# Patient Record
Sex: Female | Born: 1955 | Race: Black or African American | Hispanic: No | Marital: Married | Smoking: Never smoker
Health system: Southern US, Community
[De-identification: ages and names within clinical notes are randomized; demographics above are authoritative.]

## PROBLEM LIST (undated history)

## (undated) DIAGNOSIS — I10 Essential (primary) hypertension: Secondary | ICD-10-CM

## (undated) DIAGNOSIS — E7800 Pure hypercholesterolemia, unspecified: Secondary | ICD-10-CM

## (undated) DIAGNOSIS — R0981 Nasal congestion: Secondary | ICD-10-CM

## (undated) DIAGNOSIS — M1990 Unspecified osteoarthritis, unspecified site: Secondary | ICD-10-CM

## (undated) HISTORY — PX: ABDOMINAL HYSTERECTOMY: SHX81

## (undated) HISTORY — PX: BREAST SURGERY: SHX581

## (undated) HISTORY — PX: FOOT SURGERY: SHX648

## (undated) HISTORY — PX: TUBAL LIGATION: SHX77

---

## 1997-12-02 ENCOUNTER — Other Ambulatory Visit: Admission: RE | Admit: 1997-12-02 | Discharge: 1997-12-02 | Payer: Self-pay | Admitting: Radiology

## 1997-12-27 ENCOUNTER — Ambulatory Visit (HOSPITAL_COMMUNITY): Admission: RE | Admit: 1997-12-27 | Discharge: 1997-12-27 | Payer: Self-pay | Admitting: *Deleted

## 2001-02-04 ENCOUNTER — Other Ambulatory Visit: Admission: RE | Admit: 2001-02-04 | Discharge: 2001-02-04 | Payer: Self-pay | Admitting: Obstetrics & Gynecology

## 2001-07-30 ENCOUNTER — Encounter: Admission: RE | Admit: 2001-07-30 | Discharge: 2001-07-30 | Payer: Self-pay | Admitting: Obstetrics & Gynecology

## 2001-07-30 ENCOUNTER — Encounter: Payer: Self-pay | Admitting: Obstetrics & Gynecology

## 2001-11-14 ENCOUNTER — Inpatient Hospital Stay (HOSPITAL_COMMUNITY): Admission: RE | Admit: 2001-11-14 | Discharge: 2001-11-16 | Payer: Self-pay | Admitting: Obstetrics & Gynecology

## 2003-02-03 ENCOUNTER — Other Ambulatory Visit: Admission: RE | Admit: 2003-02-03 | Discharge: 2003-02-03 | Payer: Self-pay | Admitting: Obstetrics & Gynecology

## 2003-10-01 ENCOUNTER — Ambulatory Visit (HOSPITAL_COMMUNITY): Admission: RE | Admit: 2003-10-01 | Discharge: 2003-10-01 | Payer: Self-pay | Admitting: Geriatric Medicine

## 2004-06-26 ENCOUNTER — Emergency Department (HOSPITAL_COMMUNITY): Admission: EM | Admit: 2004-06-26 | Discharge: 2004-06-26 | Payer: Self-pay | Admitting: Emergency Medicine

## 2010-09-16 ENCOUNTER — Encounter: Payer: Self-pay | Admitting: Geriatric Medicine

## 2010-10-26 ENCOUNTER — Emergency Department (INDEPENDENT_AMBULATORY_CARE_PROVIDER_SITE_OTHER): Payer: BC Managed Care – HMO

## 2010-10-26 ENCOUNTER — Emergency Department (HOSPITAL_BASED_OUTPATIENT_CLINIC_OR_DEPARTMENT_OTHER)
Admission: EM | Admit: 2010-10-26 | Discharge: 2010-10-26 | Disposition: A | Discharge status: Home / Self Care | Payer: BC Managed Care – HMO | Attending: Emergency Medicine | Admitting: Emergency Medicine

## 2010-10-26 DIAGNOSIS — R079 Chest pain, unspecified: Secondary | ICD-10-CM

## 2010-10-26 DIAGNOSIS — I10 Essential (primary) hypertension: Secondary | ICD-10-CM | POA: Insufficient documentation

## 2010-10-26 DIAGNOSIS — M25519 Pain in unspecified shoulder: Secondary | ICD-10-CM | POA: Insufficient documentation

## 2010-10-26 DIAGNOSIS — I517 Cardiomegaly: Secondary | ICD-10-CM | POA: Insufficient documentation

## 2010-10-26 DIAGNOSIS — R51 Headache: Secondary | ICD-10-CM | POA: Insufficient documentation

## 2010-10-26 LAB — CBC
HCT: 39.3 % (ref 36.0–46.0)
Hemoglobin: 13.4 g/dL (ref 12.0–15.0)
MCH: 29 pg (ref 26.0–34.0)
MCHC: 34.1 g/dL (ref 30.0–36.0)
MCV: 85.1 fL (ref 78.0–100.0)
Platelets: 248 10*3/uL (ref 150–400)
RBC: 4.62 MIL/uL (ref 3.87–5.11)
RDW: 13.3 % (ref 11.5–15.5)
WBC: 5.6 10*3/uL (ref 4.0–10.5)

## 2010-10-26 LAB — DIFFERENTIAL
Basophils Absolute: 0 10*3/uL (ref 0.0–0.1)
Basophils Relative: 0 % (ref 0–1)
Eosinophils Absolute: 0.1 10*3/uL (ref 0.0–0.7)
Eosinophils Relative: 2 % (ref 0–5)
Lymphocytes Relative: 33 % (ref 12–46)
Lymphs Abs: 1.9 10*3/uL (ref 0.7–4.0)
Monocytes Absolute: 0.4 10*3/uL (ref 0.1–1.0)
Monocytes Relative: 8 % (ref 3–12)
Neutro Abs: 3.2 10*3/uL (ref 1.7–7.7)
Neutrophils Relative %: 57 % (ref 43–77)

## 2010-10-26 LAB — BASIC METABOLIC PANEL
BUN: 11 mg/dL (ref 6–23)
Chloride: 107 mEq/L (ref 96–112)
Creatinine, Ser: 0.7 mg/dL (ref 0.4–1.2)
Glucose, Bld: 88 mg/dL (ref 70–99)
Potassium: 3.9 mEq/L (ref 3.5–5.1)

## 2010-10-26 LAB — POCT CARDIAC MARKERS
CKMB, poc: <1 ng/mL — ABNORMAL LOW (ref 1.0–8.0)
CKMB, poc: <1 ng/mL — ABNORMAL LOW (ref 1.0–8.0)
Myoglobin, poc: 77 ng/mL (ref 12–200)
Troponin i, poc: <0.05 ng/mL (ref 0.00–0.09)
Troponin i, poc: <0.05 ng/mL (ref 0.00–0.09)

## 2010-10-26 LAB — D-DIMER, QUANTITATIVE: D-Dimer, Quant: 0.73 ug/mL-FEU — ABNORMAL HIGH (ref 0.00–0.48)

## 2010-10-26 MED ORDER — IOHEXOL 350 MG/ML SOLN
80.0000 mL | Freq: Once | INTRAVENOUS | Status: AC | PRN
  Administered 2010-10-26: 80 mL via INTRAVENOUS

## 2015-11-04 ENCOUNTER — Other Ambulatory Visit: Payer: Self-pay | Admitting: Physician Assistant

## 2015-11-04 NOTE — H&P (Signed)
Belinda Arellano comes in for consultation and treatment recommendation for ongoing continued symptoms, right heel.  Chronic problem much worse since September.  Calcification attachment spurs.  Significant tendonitis Achilles distally.  Haglund deformity.  I have looked at her previous x-rays that showed a Haglund deformity, as well as the marked spurring fragmentation Achilles attachment.  That is where all of her symptoms are.  She has exhausted conservative treatment, including anti-inflammatories, Pennsaid gel, as well as Nitroglycerin patches.  She has been doing stretching.  This has gotten the best of her.  Some days not so bad, but really not any days without pain.  She comes in to discuss definitive treatment.   Remaining history is reviewed.  Of note, she was seen back a couple of years ago for a medial meniscus tear, right knee, with associated degenerative changes there.  Although she still has some symptoms she opted not to have surgery and those have not worsened.    EXAMINATION: General exam is outlined and included in the chart.  Lungs clear to auscultation bilaterally.  Heart sounds normal.  Specifically, exquisitely tender Achilles attachment on the right.  Virtually no dorsiflexion with the knee extended.  Tender over the Haglund deformity with pre Achilles bursal swelling as well.  Nothing dramatic in the opposite left heel in regards to tenderness, swelling or loss of motion.  DISPOSITION:  We have discussed definitive treatment.  This is not going to get better short of treatment and she understands that.  We have discussed exam under anesthesia, posterior approach.  Takedown of her Achilles, removal of all spurs, removal of Haglund deformity, excise the pre Achilles bursa and then repair and reattachment of the Achilles back to the os calcis with a SpeedBridge technique.  Procedure, risks, benefits and complications reviewed.  Anticipated recovery and outcome and what is involved all outlined.   More than 30 minutes spent face-to-face covering all of this with her.  She understands and agrees.  We will see her at the time of operative intervention.  Ninetta Lights, M.D.

## 2015-11-07 ENCOUNTER — Encounter (HOSPITAL_BASED_OUTPATIENT_CLINIC_OR_DEPARTMENT_OTHER): Payer: Self-pay | Admitting: *Deleted

## 2015-11-10 ENCOUNTER — Encounter (HOSPITAL_BASED_OUTPATIENT_CLINIC_OR_DEPARTMENT_OTHER): Payer: Self-pay | Admitting: Anesthesiology

## 2015-11-10 ENCOUNTER — Ambulatory Visit (HOSPITAL_BASED_OUTPATIENT_CLINIC_OR_DEPARTMENT_OTHER): Payer: BLUE CROSS/BLUE SHIELD | Admitting: Anesthesiology

## 2015-11-10 ENCOUNTER — Ambulatory Visit (HOSPITAL_BASED_OUTPATIENT_CLINIC_OR_DEPARTMENT_OTHER)
Admission: RE | Admit: 2015-11-10 | Discharge: 2015-11-10 | Disposition: A | Discharge status: Home / Self Care | Payer: BLUE CROSS/BLUE SHIELD | Source: Ambulatory Visit | Attending: Orthopedic Surgery | Admitting: Orthopedic Surgery

## 2015-11-10 ENCOUNTER — Encounter (HOSPITAL_BASED_OUTPATIENT_CLINIC_OR_DEPARTMENT_OTHER)
Admission: RE | Disposition: A | Discharge status: Home / Self Care | Payer: Self-pay | Source: Ambulatory Visit | Attending: Orthopedic Surgery

## 2015-11-10 DIAGNOSIS — M7661 Achilles tendinitis, right leg: Secondary | ICD-10-CM | POA: Diagnosis present

## 2015-11-10 DIAGNOSIS — S86011A Strain of right Achilles tendon, initial encounter: Secondary | ICD-10-CM | POA: Insufficient documentation

## 2015-11-10 DIAGNOSIS — X58XXXA Exposure to other specified factors, initial encounter: Secondary | ICD-10-CM | POA: Diagnosis not present

## 2015-11-10 HISTORY — DX: Unspecified osteoarthritis, unspecified site: M19.90

## 2015-11-10 HISTORY — DX: Nasal congestion: R09.81

## 2015-11-10 HISTORY — PX: EXCISION HAGLUND'S DEFORMITY WITH ACHILLES TENDON REPAIR: SHX5627

## 2015-11-10 SURGERY — EXCISION HAGLUND'S DEFORMITY WITH ACHILLES TENDON REPAIR
Anesthesia: General | Site: Ankle | Laterality: Right

## 2015-11-10 MED ORDER — FENTANYL CITRATE (PF) 100 MCG/2ML IJ SOLN
INTRAMUSCULAR | Status: AC
  Filled 2015-11-10: qty 2

## 2015-11-10 MED ORDER — DEXAMETHASONE SODIUM PHOSPHATE 4 MG/ML IJ SOLN
INTRAMUSCULAR | Status: DC | PRN
  Administered 2015-11-10: 10 mg via INTRAVENOUS

## 2015-11-10 MED ORDER — ONDANSETRON HCL 4 MG/2ML IJ SOLN
INTRAMUSCULAR | Status: AC
  Filled 2015-11-10: qty 2

## 2015-11-10 MED ORDER — ONDANSETRON HCL 4 MG PO TABS: 4.0000 mg | ORAL_TABLET | Freq: Three times a day (TID) | ORAL | Status: DC | PRN

## 2015-11-10 MED ORDER — PROPOFOL 500 MG/50ML IV EMUL
INTRAVENOUS | Status: AC
  Filled 2015-11-10: qty 50

## 2015-11-10 MED ORDER — CEFAZOLIN SODIUM-DEXTROSE 2-3 GM-% IV SOLR
2.0000 g | INTRAVENOUS | Status: AC
  Administered 2015-11-10: 2 g via INTRAVENOUS

## 2015-11-10 MED ORDER — FENTANYL CITRATE (PF) 100 MCG/2ML IJ SOLN
25.0000 ug | INTRAMUSCULAR | Status: DC | PRN
  Administered 2015-11-10: 25 ug via INTRAVENOUS

## 2015-11-10 MED ORDER — PHENYLEPHRINE 40 MCG/ML (10ML) SYRINGE FOR IV PUSH (FOR BLOOD PRESSURE SUPPORT)
PREFILLED_SYRINGE | INTRAVENOUS | Status: AC
  Filled 2015-11-10: qty 10

## 2015-11-10 MED ORDER — OXYCODONE-ACETAMINOPHEN 5-325 MG PO TABS: 1.0000 | ORAL_TABLET | ORAL | Status: DC | PRN

## 2015-11-10 MED ORDER — SCOPOLAMINE 1 MG/3DAYS TD PT72: 1.0000 | MEDICATED_PATCH | Freq: Once | TRANSDERMAL | Status: DC | PRN

## 2015-11-10 MED ORDER — DEXAMETHASONE SODIUM PHOSPHATE 10 MG/ML IJ SOLN
INTRAMUSCULAR | Status: AC
  Filled 2015-11-10: qty 1

## 2015-11-10 MED ORDER — PROPOFOL 10 MG/ML IV BOLUS
INTRAVENOUS | Status: DC | PRN
  Administered 2015-11-10: 230 mg via INTRAVENOUS

## 2015-11-10 MED ORDER — LACTATED RINGERS IV SOLN: INTRAVENOUS | Status: DC

## 2015-11-10 MED ORDER — LIDOCAINE HCL (CARDIAC) 20 MG/ML IV SOLN
INTRAVENOUS | Status: DC | PRN
  Administered 2015-11-10: 50 mg via INTRAVENOUS

## 2015-11-10 MED ORDER — LACTATED RINGERS IV SOLN
INTRAVENOUS | Status: DC
  Administered 2015-11-10: 10 mL/h via INTRAVENOUS

## 2015-11-10 MED ORDER — MIDAZOLAM HCL 2 MG/2ML IJ SOLN
1.0000 mg | INTRAMUSCULAR | Status: DC | PRN
Start: 2015-11-10 — End: 2015-11-10

## 2015-11-10 MED ORDER — GLYCOPYRROLATE 0.2 MG/ML IJ SOLN: 0.2000 mg | Freq: Once | INTRAMUSCULAR | Status: DC | PRN

## 2015-11-10 MED ORDER — EPHEDRINE SULFATE 50 MG/ML IJ SOLN
INTRAMUSCULAR | Status: AC
  Filled 2015-11-10: qty 1

## 2015-11-10 MED ORDER — ONDANSETRON HCL 4 MG/2ML IJ SOLN
INTRAMUSCULAR | Status: DC | PRN
  Administered 2015-11-10: 4 mg via INTRAVENOUS

## 2015-11-10 MED ORDER — SUCCINYLCHOLINE CHLORIDE 20 MG/ML IJ SOLN
INTRAMUSCULAR | Status: DC | PRN
  Administered 2015-11-10: 80 mg via INTRAVENOUS

## 2015-11-10 MED ORDER — CEFAZOLIN SODIUM-DEXTROSE 2-3 GM-% IV SOLR
INTRAVENOUS | Status: AC
  Filled 2015-11-10: qty 50

## 2015-11-10 MED ORDER — LIDOCAINE HCL (CARDIAC) 20 MG/ML IV SOLN
INTRAVENOUS | Status: AC
  Filled 2015-11-10: qty 5

## 2015-11-10 MED ORDER — CHLORHEXIDINE GLUCONATE 4 % EX LIQD: 60.0000 mL | Freq: Once | CUTANEOUS | Status: DC

## 2015-11-10 MED ORDER — MIDAZOLAM HCL 2 MG/2ML IJ SOLN
INTRAMUSCULAR | Status: AC
  Filled 2015-11-10: qty 2

## 2015-11-10 MED ORDER — FENTANYL CITRATE (PF) 100 MCG/2ML IJ SOLN
50.0000 ug | INTRAMUSCULAR | Status: DC | PRN
  Administered 2015-11-10 (×2): 50 ug via INTRAVENOUS

## 2015-11-10 SURGICAL SUPPLY — 69 items
BANDAGE ACE 4X5 VEL STRL LF (GAUZE/BANDAGES/DRESSINGS) ×2 IMPLANT
BANDAGE ACE 6X5 VEL STRL LF (GAUZE/BANDAGES/DRESSINGS) ×2 IMPLANT
BANDAGE ESMARK 6X9 LF (GAUZE/BANDAGES/DRESSINGS) ×1 IMPLANT
BLADE SURG 15 STRL LF DISP TIS (BLADE) ×2 IMPLANT
BLADE SURG 15 STRL SS (BLADE) ×2
BNDG COHESIVE 4X5 TAN STRL (GAUZE/BANDAGES/DRESSINGS) ×2 IMPLANT
BNDG ESMARK 6X9 LF (GAUZE/BANDAGES/DRESSINGS) ×2
CANISTER SUCT 1200ML W/VALVE (MISCELLANEOUS) IMPLANT
COVER BACK TABLE 60X90IN (DRAPES) ×2 IMPLANT
CUFF TOURNIQUET SINGLE 34IN LL (TOURNIQUET CUFF) IMPLANT
CUFF TOURNIQUET SINGLE 44IN (TOURNIQUET CUFF) ×2 IMPLANT
DRAPE EXTREMITY T 121X128X90 (DRAPE) ×2 IMPLANT
DRAPE IMP U-DRAPE 54X76 (DRAPES) ×2 IMPLANT
DRAPE OEC MINIVIEW 54X84 (DRAPES) ×2 IMPLANT
DRAPE U-SHAPE 47X51 STRL (DRAPES) ×2 IMPLANT
DRSG PAD ABDOMINAL 8X10 ST (GAUZE/BANDAGES/DRESSINGS) ×2 IMPLANT
DRSG XEROFORM 1X8 (GAUZE/BANDAGES/DRESSINGS) ×2 IMPLANT
DURAPREP 26ML APPLICATOR (WOUND CARE) ×2 IMPLANT
ELECT REM PT RETURN 9FT ADLT (ELECTROSURGICAL) ×2
ELECTRODE REM PT RTRN 9FT ADLT (ELECTROSURGICAL) ×1 IMPLANT
GAUZE SPONGE 4X4 12PLY STRL (GAUZE/BANDAGES/DRESSINGS) ×2 IMPLANT
GAUZE XEROFORM 1X8 LF (GAUZE/BANDAGES/DRESSINGS) IMPLANT
GLOVE BIOGEL PI IND STRL 7.0 (GLOVE) ×1 IMPLANT
GLOVE BIOGEL PI INDICATOR 7.0 (GLOVE) ×1
GLOVE ECLIPSE 7.0 STRL STRAW (GLOVE) ×2 IMPLANT
GLOVE SURG ORTHO 8.0 STRL STRW (GLOVE) ×2 IMPLANT
GOWN STRL REUS W/ TWL LRG LVL3 (GOWN DISPOSABLE) ×2 IMPLANT
GOWN STRL REUS W/ TWL XL LVL3 (GOWN DISPOSABLE) ×1 IMPLANT
GOWN STRL REUS W/TWL LRG LVL3 (GOWN DISPOSABLE) ×2
GOWN STRL REUS W/TWL XL LVL3 (GOWN DISPOSABLE) ×3 IMPLANT
IMPL SYS BIOCOMP ACH SPEED (Anchor) ×1 IMPLANT
IMPLANT SYS BIOCOMP ACH SPEED (Anchor) ×2 IMPLANT
NEEDLE 1/2 CIR CATGUT .05X1.09 (NEEDLE) IMPLANT
NEEDLE HYPO 22GX1.5 SAFETY (NEEDLE) IMPLANT
PACK BASIN DAY SURGERY FS (CUSTOM PROCEDURE TRAY) ×2 IMPLANT
PAD CAST 4YDX4 CTTN HI CHSV (CAST SUPPLIES) ×2 IMPLANT
PADDING CAST ABS 4INX4YD NS (CAST SUPPLIES) ×2
PADDING CAST ABS COTTON 4X4 ST (CAST SUPPLIES) ×2 IMPLANT
PADDING CAST COTTON 4X4 STRL (CAST SUPPLIES) ×2
PADDING CAST COTTON 6X4 STRL (CAST SUPPLIES) ×2 IMPLANT
PADDING WEBRIL 4 STERILE (GAUZE/BANDAGES/DRESSINGS) ×2 IMPLANT
PENCIL BUTTON HOLSTER BLD 10FT (ELECTRODE) ×2 IMPLANT
SLEEVE SCD COMPRESS KNEE MED (MISCELLANEOUS) IMPLANT
SPLINT FAST PLASTER 5X30 (CAST SUPPLIES) ×5
SPLINT FIBERGLASS 4X30 (CAST SUPPLIES) IMPLANT
SPLINT PLASTER CAST FAST 5X30 (CAST SUPPLIES) ×5 IMPLANT
STAPLER VISISTAT 35W (STAPLE) IMPLANT
STOCKINETTE 6  STRL (DRAPES) ×1
STOCKINETTE 6 STRL (DRAPES) ×1 IMPLANT
SUCTION FRAZIER HANDLE 10FR (MISCELLANEOUS) ×1
SUCTION TUBE FRAZIER 10FR DISP (MISCELLANEOUS) ×1 IMPLANT
SUT ETHILON 3 0 PS 1 (SUTURE) ×4 IMPLANT
SUT FIBERWIRE #2 38 T-5 BLUE (SUTURE)
SUT VIC AB 0 CT1 27 (SUTURE)
SUT VIC AB 0 CT1 27XBRD ANBCTR (SUTURE) IMPLANT
SUT VIC AB 1 CT1 27 (SUTURE) ×1
SUT VIC AB 1 CT1 27XBRD ANBCTR (SUTURE) ×1 IMPLANT
SUT VIC AB 2-0 SH 27 (SUTURE)
SUT VIC AB 2-0 SH 27XBRD (SUTURE) IMPLANT
SUT VIC AB 3-0 SH 27 (SUTURE)
SUT VIC AB 3-0 SH 27X BRD (SUTURE) IMPLANT
SUTURE FIBERWR #2 38 T-5 BLUE (SUTURE) IMPLANT
SYR BULB 3OZ (MISCELLANEOUS) ×2 IMPLANT
SYR CONTROL 10ML LL (SYRINGE) IMPLANT
TOWEL OR 17X24 6PK STRL BLUE (TOWEL DISPOSABLE) ×4 IMPLANT
TOWEL OR NON WOVEN STRL DISP B (DISPOSABLE) ×2 IMPLANT
TUBE CONNECTING 20X1/4 (TUBING) ×2 IMPLANT
UNDERPAD 30X30 (UNDERPADS AND DIAPERS) ×2 IMPLANT
YANKAUER SUCT BULB TIP NO VENT (SUCTIONS) IMPLANT

## 2015-11-10 NOTE — Anesthesia Preprocedure Evaluation (Signed)
Anesthesia Evaluation  Patient identified by MRN, date of birth, ID band Patient awake    Reviewed: Allergy & Precautions, H&P , Patient's Chart, lab work & pertinent test results, reviewed documented beta blocker date and time   Airway Mallampati: II  TM Distance: >3 FB Neck ROM: full    Dental no notable dental hx.    Pulmonary    Pulmonary exam normal breath sounds clear to auscultation       Cardiovascular  Rhythm:regular Rate:Normal     Neuro/Psych    GI/Hepatic   Endo/Other    Renal/GU      Musculoskeletal   Abdominal   Peds  Hematology   Anesthesia Other Findings   Reproductive/Obstetrics                             Anesthesia Physical Anesthesia Plan  ASA: II  Anesthesia Plan:    Post-op Pain Management:    Induction: Intravenous  Airway Management Planned: LMA  Additional Equipment:   Intra-op Plan:   Post-operative Plan:   Informed Consent: I have reviewed the patients History and Physical, chart, labs and discussed the procedure including the risks, benefits and alternatives for the proposed anesthesia with the patient or authorized representative who has indicated his/her understanding and acceptance.   Dental Advisory Given and Dental advisory given  Plan Discussed with: CRNA and Surgeon  Anesthesia Plan Comments: (Discussed GA with LMA, possible sore throat, potential need to switch to ETT, N/V, pulmonary aspiration. Questions answered. )        Anesthesia Quick Evaluation  

## 2015-11-10 NOTE — Transfer of Care (Signed)
Immediate Anesthesia Transfer of Care Note  Patient: Belinda Arellano  Procedure(s) Performed: Procedure(s) with comments: RIGHT ACHILLES TENDON REPAIR, EXCISION PARTIAL CALCANEUS (Right) - ANESTHESIA: GENERAL, BLOCK  Patient Location: PACU  Anesthesia Type:General  Level of Consciousness: awake and sedated  Airway & Oxygen Therapy: Patient Spontanous Breathing and Patient connected to face mask oxygen  Post-op Assessment: Report given to RN and Post -op Vital signs reviewed and stable  Post vital signs: Reviewed and stable  Last Vitals:  Filed Vitals:   11/10/15 0915 11/10/15 0920  BP: 130/78 111/71  Pulse:    Temp:    Resp: 14 12    Complications: No apparent anesthesia complications

## 2015-11-10 NOTE — Anesthesia Procedure Notes (Addendum)
Anesthesia Regional Block:  Popliteal block  Pre-Anesthetic Checklist: ,, timeout performed, Correct Patient, Correct Site, Correct Laterality, Correct Procedure, Correct Position, site marked,,,  Laterality: Right  Prep: chloraprep       Needles:   Needle Type: Echogenic Needle     Needle Length: 9cm 9 cm Needle Gauge: 24 and 24 G    Additional Needles:  Procedures: ultrasound guided (picture in chart) Popliteal block Narrative:  Start time: 11/10/2015 9:11 AM Injection made incrementally with aspirations every 5 mL. Anesthesiologist: Lyndle Herrlich  Additional Notes: Dr Al Corpus in attendance 25cc .5% Bupiv   Procedure Name: Intubation Performed by: Terrance Mass Pre-anesthesia Checklist: Patient identified, Emergency Drugs available, Suction available and Patient being monitored Patient Re-evaluated:Patient Re-evaluated prior to inductionOxygen Delivery Method: Circle System Utilized Preoxygenation: Pre-oxygenation with 100% oxygen Intubation Type: IV induction Ventilation: Mask ventilation without difficulty Laryngoscope Size: Miller, 1 and Glidescope Grade View: Grade I Tube type: Oral Number of attempts: 1 Airway Equipment and Method: Stylet and Oral airway Placement Confirmation: ETT inserted through vocal cords under direct vision,  positive ETCO2 and breath sounds checked- equal and bilateral Secured at: 22 cm Tube secured with: Tape Dental Injury: Teeth and Oropharynx as per pre-operative assessment

## 2015-11-10 NOTE — Anesthesia Postprocedure Evaluation (Signed)
Anesthesia Post Note  Patient: Belinda Arellano  Procedure(s) Performed: Procedure(s) (LRB): RIGHT ACHILLES TENDON REPAIR, EXCISION PARTIAL CALCANEUS (Right)  Patient location during evaluation: PACU Anesthesia Type: General Level of consciousness: awake and alert and patient cooperative Pain management: pain level controlled Vital Signs Assessment: post-procedure vital signs reviewed and stable Respiratory status: spontaneous breathing and respiratory function stable Cardiovascular status: stable Anesthetic complications: no    Last Vitals:  Filed Vitals:   11/10/15 1230 11/10/15 1245  BP: 116/85 117/79  Pulse: 63 58  Temp:    Resp: 10 13    Last Pain:  Filed Vitals:   11/10/15 1255  PainSc: [place]

## 2015-11-10 NOTE — Discharge Instructions (Signed)
Care After Refer to this sheet in the next few weeks. These discharge instructions provide you with general information on caring for yourself after you leave the hospital. Your caregiver may also give you specific instructions. Your treatment has been planned according to the most current medical practices available, but unavoidable complications sometimes occur. If you have any problems or questions after discharge, please call your caregiver. HOME INSTRUCTIONS You may resume a normal diet and activities as directed. Walk with crutches NON WEIGHT BEARING Do NOT get cast wet.  Do NOT remove dressing until you come back to the doctor Only take over-the-counter or prescription medicines for pain, discomfort, or fever as directed by your caregiver.  Eat a well-balanced diet.  Avoid lifting or driving until you are instructed otherwise.  Make an appointment to see your caregiver for stitches (suture) or staple removal as directed.   SEEK MEDICAL CARE IF: You have swelling of your calf or leg.  You develop shortness of breath or chest pain.  You have redness, swelling, or increasing pain in the wound.  There is pus or any unusual drainage coming from the surgical site.  You notice a bad smell coming from the surgical site or dressing.  The surgical site breaks open after sutures or staples have been removed.  There is persistent bleeding from the suture or staple line.  You are getting worse or are not improving.  You have any other questions or concerns.  SEEK IMMEDIATE MEDICAL CARE IF:  You have a fever.  You develop a rash.  You have difficulty breathing.  You develop any reaction or side effects to medicines given.  Your knee motion is decreasing rather than improving.  MAKE SURE YOU:  Understand these instructions.  Will watch your condition.  Will get help right away if you are not doing well or get worse.    Post Anesthesia Home Care Instructions  Activity: Get plenty of rest  for the remainder of the day. A responsible adult should stay with you for 24 hours following the procedure.  For the next 24 hours, DO NOT: -Drive a car -Paediatric nurse -Drink alcoholic beverages -Take any medication unless instructed by your physician -Make any legal decisions or sign important papers.  Meals: Start with liquid foods such as gelatin or soup. Progress to regular foods as tolerated. Avoid greasy, spicy, heavy foods. If nausea and/or vomiting occur, drink only clear liquids until the nausea and/or vomiting subsides. Call your physician if vomiting continues.  Special Instructions/Symptoms: Your throat may feel dry or sore from the anesthesia or the breathing tube placed in your throat during surgery. If this causes discomfort, gargle with warm salt water. The discomfort should disappear within 24 hours.  If you had a scopolamine patch placed behind your ear for the management of post- operative nausea and/or vomiting:  1. The medication in the patch is effective for 72 hours, after which it should be removed.  Wrap patch in a tissue and discard in the trash. Wash hands thoroughly with soap and water. 2. You may remove the patch earlier than 72 hours if you experience unpleasant side effects which may include dry mouth, dizziness or visual disturbances. 3. Avoid touching the patch. Wash your hands with soap and water after contact with the patch.    Regional Anesthesia Blocks  1. Numbness or the inability to move the "blocked" extremity may last from 3-48 hours after placement. The length of time depends on the medication injected and your  individual response to the medication. If the numbness is not going away after 48 hours, call your surgeon.  2. The extremity that is blocked will need to be protected until the numbness is gone and the  Strength has returned. Because you cannot feel it, you will need to take extra care to avoid injury. Because it may be weak, you may  have difficulty moving it or using it. You may not know what position it is in without looking at it while the block is in effect.  3. For blocks in the legs and feet, returning to weight bearing and walking needs to be done carefully. You will need to wait until the numbness is entirely gone and the strength has returned. You should be able to move your leg and foot normally before you try and bear weight or walk. You will need someone to be with you when you first try to ensure you do not fall and possibly risk injury.  4. Bruising and tenderness at the needle site are common side effects and will resolve in a few days.  5. Persistent numbness or new problems with movement should be communicated to the surgeon or the [facility] [phone] [place] [phone]).

## 2015-11-10 NOTE — Interval H&P Note (Signed)
History and Physical Interval Note:  11/10/2015 8:48 AM  Belinda Arellano  has presented today for surgery, with the diagnosis of OSTEOPHYTE RIGHT ANKLE, SPONTANEOUS RUPTURE OF FLEXOR TENDONS RIGHT LOWER LEG  The various methods of treatment have been discussed with the patient and family. After consideration of risks, benefits and other options for treatment, the patient has consented to  Procedure(s) with comments: RIGHT ACHILLES TENDON REPAIR, EXCISION PARTIAL CALCANEUS (Right) - ANESTHESIA: GENERAL, BLOCK as a surgical intervention .  The patient's history has been reviewed, patient examined, no change in status, stable for surgery.  I have reviewed the patient's chart and labs.  Questions were answered to the patient's satisfaction.     Ninetta Lights

## 2015-11-10 NOTE — Progress Notes (Signed)
Assisted Dr. Glennon Mac with right, ultrasound guided, popliteal block. Side rails up, monitors on throughout procedure. See vital signs in flow sheet. Tolerated Procedure well.

## 2015-11-11 ENCOUNTER — Encounter (HOSPITAL_BASED_OUTPATIENT_CLINIC_OR_DEPARTMENT_OTHER): Payer: Self-pay | Admitting: Orthopedic Surgery

## 2015-11-11 NOTE — Op Note (Signed)
NAME:  Belinda Arellano, REMPFER NO.:  [id number]  MEDICAL RECORD NO.:  [id number]  LOCATION:                                 FACILITY:  PHYSICIAN:  Ninetta Lights, M.D. DATE OF BIRTH:  [birth year]  DATE OF PROCEDURE:  11/10/2015 DATE OF DISCHARGE:                              OPERATIVE REPORT   PREOPERATIVE DIAGNOSES:  Right heel chronic Achilles attachment tendonitis, partial tearing, and attachment of spurs.  Haglund's deformity with pre-Achilles bursitis.  POSTOPERATIVE DIAGNOSES:  Right heel chronic Achilles attachment tendonitis, partial tearing, and attachment of spurs.  Haglund's deformity with pre-Achilles bursitis.  PROCEDURE:  Right heel exploration, complete debridement, detachment of Achilles tendon with removal of attachment spurs.  Removal of Haglund's deformity and pre-Achilles bursa, repair, reattachment of the Achilles tendon utilizing Arthrex SpeedBridge technique.  SURGEON:  Ninetta Lights, MD  ASSISTANT:  Elmyra Ricks, PA present throughout the entire case and necessary for timely completion of procedure.  ANESTHESIA:  General.  BLOOD LOSS:  Minimal.  SPECIMENS:  None.  CULTURES:  None.  COMPLICATIONS:  None.  DRESSINGS:  Soft compressive short leg splint.  TOURNIQUET TIME:  45 minutes.  DESCRIPTION OF PROCEDURE:  The patient was brought to the operating room, placed on the operating table in supine position.  After adequate anesthesia had been obtained, tourniquet applied, turned to a prone position, prepped and draped in usual sterile fashion.  Exsanguinated with elevation of Esmarch.  Tourniquet inflated to 350 mmHg. Fluoroscopic guidance throughout.  Longitudinal incision.  Lateral aspect of the Achilles down to the lateral border of the os calcis. Skin and subcutaneous tissue divided.  A lot of fatty tissue debrided for visualization.  Tearing of half of the tendon at the distal attachment.  The distal attachment was  taken down, retracted anteriorly. All attachments, spurs, abnormal inflammatory tissue, and necrotic tissue debrided back to healthy tissue.  I then removed the Haglund's deformity with oscillating saw rongeur.  Pre Achilles bursa excised. Adequacy of decompression debridement confirmed visually and fluoroscopically.  Nice bed of bone for attachment.  Four anchor holes were then made and tapped for the Arthrex SpeedBridge repair.  Sutures passed through the tendon, crossed over, and then anchored down into distal holes.  Able to get her plantigrade with the knee flexed at completion.  Wound irrigated, closed with nylon.  Sterile compressive dressing applied.  Short-leg splint applied.  Tourniquet deflated. Returned to a supine position.  Anesthesia reversed.  Brought to the recovery room.  Tolerated the surgery well.  No complications.     Ninetta Lights, M.D.     DFM/MEDQ  D:  11/10/2015  T:  11/10/2015  Job:  [id number]

## 2015-11-16 ENCOUNTER — Encounter (HOSPITAL_BASED_OUTPATIENT_CLINIC_OR_DEPARTMENT_OTHER): Payer: Self-pay | Admitting: Orthopedic Surgery

## 2015-12-02 ENCOUNTER — Encounter (HOSPITAL_BASED_OUTPATIENT_CLINIC_OR_DEPARTMENT_OTHER): Payer: Self-pay | Admitting: Orthopedic Surgery

## 2015-12-27 DIAGNOSIS — M25771 Osteophyte, right ankle: Secondary | ICD-10-CM | POA: Diagnosis not present

## 2015-12-29 DIAGNOSIS — M7661 Achilles tendinitis, right leg: Secondary | ICD-10-CM | POA: Diagnosis not present

## 2015-12-29 DIAGNOSIS — M25671 Stiffness of right ankle, not elsewhere classified: Secondary | ICD-10-CM | POA: Diagnosis not present

## 2015-12-29 DIAGNOSIS — M25571 Pain in right ankle and joints of right foot: Secondary | ICD-10-CM | POA: Diagnosis not present

## 2015-12-29 DIAGNOSIS — R262 Difficulty in walking, not elsewhere classified: Secondary | ICD-10-CM | POA: Diagnosis not present

## 2016-01-03 DIAGNOSIS — M7661 Achilles tendinitis, right leg: Secondary | ICD-10-CM | POA: Diagnosis not present

## 2016-01-03 DIAGNOSIS — M25571 Pain in right ankle and joints of right foot: Secondary | ICD-10-CM | POA: Diagnosis not present

## 2016-01-03 DIAGNOSIS — R262 Difficulty in walking, not elsewhere classified: Secondary | ICD-10-CM | POA: Diagnosis not present

## 2016-01-03 DIAGNOSIS — M25671 Stiffness of right ankle, not elsewhere classified: Secondary | ICD-10-CM | POA: Diagnosis not present

## 2016-01-06 DIAGNOSIS — M7661 Achilles tendinitis, right leg: Secondary | ICD-10-CM | POA: Diagnosis not present

## 2016-01-06 DIAGNOSIS — M25671 Stiffness of right ankle, not elsewhere classified: Secondary | ICD-10-CM | POA: Diagnosis not present

## 2016-01-06 DIAGNOSIS — M25571 Pain in right ankle and joints of right foot: Secondary | ICD-10-CM | POA: Diagnosis not present

## 2016-01-06 DIAGNOSIS — R262 Difficulty in walking, not elsewhere classified: Secondary | ICD-10-CM | POA: Diagnosis not present

## 2016-01-10 DIAGNOSIS — M25771 Osteophyte, right ankle: Secondary | ICD-10-CM | POA: Diagnosis not present

## 2016-01-10 DIAGNOSIS — M7661 Achilles tendinitis, right leg: Secondary | ICD-10-CM | POA: Diagnosis not present

## 2016-01-10 DIAGNOSIS — R262 Difficulty in walking, not elsewhere classified: Secondary | ICD-10-CM | POA: Diagnosis not present

## 2016-01-10 DIAGNOSIS — M25671 Stiffness of right ankle, not elsewhere classified: Secondary | ICD-10-CM | POA: Diagnosis not present

## 2016-01-13 DIAGNOSIS — M7661 Achilles tendinitis, right leg: Secondary | ICD-10-CM | POA: Diagnosis not present

## 2016-01-13 DIAGNOSIS — M25571 Pain in right ankle and joints of right foot: Secondary | ICD-10-CM | POA: Diagnosis not present

## 2016-01-13 DIAGNOSIS — R262 Difficulty in walking, not elsewhere classified: Secondary | ICD-10-CM | POA: Diagnosis not present

## 2016-01-13 DIAGNOSIS — M25671 Stiffness of right ankle, not elsewhere classified: Secondary | ICD-10-CM | POA: Diagnosis not present

## 2016-01-24 DIAGNOSIS — M25671 Stiffness of right ankle, not elsewhere classified: Secondary | ICD-10-CM | POA: Diagnosis not present

## 2016-01-24 DIAGNOSIS — M7661 Achilles tendinitis, right leg: Secondary | ICD-10-CM | POA: Diagnosis not present

## 2016-01-24 DIAGNOSIS — M25571 Pain in right ankle and joints of right foot: Secondary | ICD-10-CM | POA: Diagnosis not present

## 2016-01-24 DIAGNOSIS — R262 Difficulty in walking, not elsewhere classified: Secondary | ICD-10-CM | POA: Diagnosis not present

## 2016-01-27 DIAGNOSIS — R262 Difficulty in walking, not elsewhere classified: Secondary | ICD-10-CM | POA: Diagnosis not present

## 2016-01-27 DIAGNOSIS — M25571 Pain in right ankle and joints of right foot: Secondary | ICD-10-CM | POA: Diagnosis not present

## 2016-01-27 DIAGNOSIS — M7661 Achilles tendinitis, right leg: Secondary | ICD-10-CM | POA: Diagnosis not present

## 2016-01-27 DIAGNOSIS — M25671 Stiffness of right ankle, not elsewhere classified: Secondary | ICD-10-CM | POA: Diagnosis not present

## 2016-01-31 DIAGNOSIS — R262 Difficulty in walking, not elsewhere classified: Secondary | ICD-10-CM | POA: Diagnosis not present

## 2016-01-31 DIAGNOSIS — M25671 Stiffness of right ankle, not elsewhere classified: Secondary | ICD-10-CM | POA: Diagnosis not present

## 2016-01-31 DIAGNOSIS — M7661 Achilles tendinitis, right leg: Secondary | ICD-10-CM | POA: Diagnosis not present

## 2016-01-31 DIAGNOSIS — M25571 Pain in right ankle and joints of right foot: Secondary | ICD-10-CM | POA: Diagnosis not present

## 2016-02-07 DIAGNOSIS — M25571 Pain in right ankle and joints of right foot: Secondary | ICD-10-CM | POA: Diagnosis not present

## 2016-02-07 DIAGNOSIS — M7661 Achilles tendinitis, right leg: Secondary | ICD-10-CM | POA: Diagnosis not present

## 2016-02-07 DIAGNOSIS — M25671 Stiffness of right ankle, not elsewhere classified: Secondary | ICD-10-CM | POA: Diagnosis not present

## 2016-02-07 DIAGNOSIS — R262 Difficulty in walking, not elsewhere classified: Secondary | ICD-10-CM | POA: Diagnosis not present

## 2016-02-10 DIAGNOSIS — M7661 Achilles tendinitis, right leg: Secondary | ICD-10-CM | POA: Diagnosis not present

## 2016-02-10 DIAGNOSIS — M25671 Stiffness of right ankle, not elsewhere classified: Secondary | ICD-10-CM | POA: Diagnosis not present

## 2016-02-10 DIAGNOSIS — M25571 Pain in right ankle and joints of right foot: Secondary | ICD-10-CM | POA: Diagnosis not present

## 2016-02-10 DIAGNOSIS — M25771 Osteophyte, right ankle: Secondary | ICD-10-CM | POA: Diagnosis not present

## 2016-02-14 DIAGNOSIS — M25571 Pain in right ankle and joints of right foot: Secondary | ICD-10-CM | POA: Diagnosis not present

## 2016-02-14 DIAGNOSIS — M25771 Osteophyte, right ankle: Secondary | ICD-10-CM | POA: Diagnosis not present

## 2016-02-14 DIAGNOSIS — M25671 Stiffness of right ankle, not elsewhere classified: Secondary | ICD-10-CM | POA: Diagnosis not present

## 2016-02-14 DIAGNOSIS — M7661 Achilles tendinitis, right leg: Secondary | ICD-10-CM | POA: Diagnosis not present

## 2016-02-17 DIAGNOSIS — M25571 Pain in right ankle and joints of right foot: Secondary | ICD-10-CM | POA: Diagnosis not present

## 2016-02-17 DIAGNOSIS — M25671 Stiffness of right ankle, not elsewhere classified: Secondary | ICD-10-CM | POA: Diagnosis not present

## 2016-02-17 DIAGNOSIS — M7661 Achilles tendinitis, right leg: Secondary | ICD-10-CM | POA: Diagnosis not present

## 2016-02-17 DIAGNOSIS — R262 Difficulty in walking, not elsewhere classified: Secondary | ICD-10-CM | POA: Diagnosis not present

## 2016-06-11 DIAGNOSIS — Z79899 Other long term (current) drug therapy: Secondary | ICD-10-CM | POA: Diagnosis not present

## 2016-06-11 DIAGNOSIS — Z23 Encounter for immunization: Secondary | ICD-10-CM | POA: Diagnosis not present

## 2016-06-11 DIAGNOSIS — Z0000 Encounter for general adult medical examination without abnormal findings: Secondary | ICD-10-CM | POA: Diagnosis not present

## 2016-06-11 DIAGNOSIS — Z13220 Encounter for screening for lipoid disorders: Secondary | ICD-10-CM | POA: Diagnosis not present

## 2016-06-12 DIAGNOSIS — M1712 Unilateral primary osteoarthritis, left knee: Secondary | ICD-10-CM | POA: Diagnosis not present

## 2016-06-15 DIAGNOSIS — Z1231 Encounter for screening mammogram for malignant neoplasm of breast: Secondary | ICD-10-CM | POA: Diagnosis not present

## 2016-08-29 ENCOUNTER — Other Ambulatory Visit: Payer: Self-pay | Admitting: Gastroenterology

## 2016-08-31 DIAGNOSIS — Z6841 Body Mass Index (BMI) 40.0 and over, adult: Secondary | ICD-10-CM | POA: Diagnosis not present

## 2016-08-31 DIAGNOSIS — Z01419 Encounter for gynecological examination (general) (routine) without abnormal findings: Secondary | ICD-10-CM | POA: Diagnosis not present

## 2016-09-13 ENCOUNTER — Encounter (HOSPITAL_COMMUNITY): Payer: Self-pay

## 2016-09-17 ENCOUNTER — Ambulatory Visit (HOSPITAL_COMMUNITY)
Admission: RE | Admit: 2016-09-17 | Discharge: 2016-09-17 | Disposition: A | Discharge status: Home / Self Care | Payer: BLUE CROSS/BLUE SHIELD | Source: Ambulatory Visit | Attending: Gastroenterology | Admitting: Gastroenterology

## 2016-09-17 ENCOUNTER — Ambulatory Visit (HOSPITAL_COMMUNITY): Payer: BLUE CROSS/BLUE SHIELD | Admitting: Anesthesiology

## 2016-09-17 ENCOUNTER — Encounter (HOSPITAL_COMMUNITY): Payer: Self-pay

## 2016-09-17 ENCOUNTER — Encounter (HOSPITAL_COMMUNITY)
Admission: RE | Disposition: A | Discharge status: Home / Self Care | Payer: Self-pay | Source: Ambulatory Visit | Attending: Gastroenterology

## 2016-09-17 DIAGNOSIS — D123 Benign neoplasm of transverse colon: Secondary | ICD-10-CM | POA: Diagnosis not present

## 2016-09-17 DIAGNOSIS — Z6841 Body Mass Index (BMI) 40.0 and over, adult: Secondary | ICD-10-CM | POA: Diagnosis not present

## 2016-09-17 DIAGNOSIS — Z1211 Encounter for screening for malignant neoplasm of colon: Secondary | ICD-10-CM | POA: Diagnosis not present

## 2016-09-17 HISTORY — PX: COLONOSCOPY WITH PROPOFOL: SHX5780

## 2016-09-17 SURGERY — COLONOSCOPY WITH PROPOFOL
Anesthesia: Monitor Anesthesia Care

## 2016-09-17 MED ORDER — PROPOFOL 10 MG/ML IV BOLUS
INTRAVENOUS | Status: AC
  Filled 2016-09-17: qty 40

## 2016-09-17 MED ORDER — PROPOFOL 500 MG/50ML IV EMUL
INTRAVENOUS | Status: DC | PRN
  Administered 2016-09-17: 140 ug/kg/min via INTRAVENOUS

## 2016-09-17 MED ORDER — PROPOFOL 10 MG/ML IV BOLUS
INTRAVENOUS | Status: DC | PRN
  Administered 2016-09-17: 20 mg via INTRAVENOUS

## 2016-09-17 MED ORDER — PROPOFOL 10 MG/ML IV BOLUS
INTRAVENOUS | Status: AC
  Filled 2016-09-17: qty 20

## 2016-09-17 MED ORDER — LIDOCAINE 2% (20 MG/ML) 5 ML SYRINGE
INTRAMUSCULAR | Status: AC
  Filled 2016-09-17: qty 5

## 2016-09-17 MED ORDER — SODIUM CHLORIDE 0.9 % IV SOLN: INTRAVENOUS | Status: DC

## 2016-09-17 MED ORDER — LIDOCAINE 2% (20 MG/ML) 5 ML SYRINGE
INTRAMUSCULAR | Status: DC | PRN
  Administered 2016-09-17: 100 mg via INTRAVENOUS

## 2016-09-17 MED ORDER — LACTATED RINGERS IV SOLN
INTRAVENOUS | Status: DC
  Administered 2016-09-17: 10:00:00 via INTRAVENOUS

## 2016-09-17 SURGICAL SUPPLY — 21 items

## 2016-09-17 NOTE — Op Note (Signed)
[facility] Patient Name: Belinda Arellano Procedure Date: 09/17/2016 MRN: [id number] Attending MD: Garlan Fair , MD Date of Birth: March 14, 1956 CSN: [id number] Age: 61 Admit Type: Outpatient Procedure:                Colonoscopy Indications:              Screening for colorectal malignant neoplasm Providers:                Garlan Fair, MD, Hilma Favors, RN, Despina Pole Tech, Technician, Danley Danker, CRNA Referring MD:              Medicines:                Propofol per Anesthesia Complications:            No immediate complications. Estimated Blood Loss:     Estimated blood loss was minimal. Procedure:                Pre-Anesthesia Assessment:                           - Prior to the procedure, a History and Physical                            was performed, and patient medications and                            allergies were reviewed. The patient's tolerance of                            previous anesthesia was also reviewed. The risks                            and benefits of the procedure and the sedation                            options and risks were discussed with the patient.                            All questions were answered, and informed consent                            was obtained. Prior Anticoagulants: The patient has                            taken no previous anticoagulant or antiplatelet                            agents. ASA Grade Assessment: II - A patient with                            mild systemic disease. After reviewing the risks  and benefits, the patient was deemed in                            satisfactory condition to undergo the procedure.                           After obtaining informed consent, the colonoscope                            was passed under direct vision. Throughout the                            procedure, the patient's blood pressure, pulse, and                          oxygen saturations were monitored continuously. The                            [id number] scope was introduced through                            the anus and advanced to the the cecum, identified                            by appendiceal orifice and ileocecal valve. The                            colonoscopy was performed without difficulty. The                            patient tolerated the procedure well. The quality                            of the bowel preparation was good. The appendiceal                            orifice and the rectum were photographed. Findings:      The perianal and digital rectal examinations were normal.      A 3 mm polyp was found in the mid transverse colon. The polyp was       sessile. The polyp was removed with a cold biopsy forceps. Resection and       retrieval were complete.      Two sessile polyps were found in the distal transverse colon. The polyps       were 5 mm in size. These polyps were removed with a cold snare.       Resection and retrieval were complete.      The exam was otherwise without abnormality. Impression:               - One 3 mm polyp in the mid transverse colon,                            removed with a cold biopsy forceps. Resected and  retrieved.                           - Two 5 mm polyps in the distal transverse colon,                            removed with a cold snare. Resected and retrieved.                           - The examination was otherwise normal. Moderate Sedation:      N/A- Per Anesthesia Care Recommendation:           - Patient has a contact number available for                            emergencies. The signs and symptoms of potential                            delayed complications were discussed with the                            patient. Return to normal activities tomorrow.                            Written discharge instructions were  provided to the                            patient.                           - Repeat colonoscopy date to be determined after                            pending pathology results are reviewed for                            surveillance.                           - Resume previous diet.                           - Continue present medications. Procedure Code(s):        --- Professional ---                           [phone], Colonoscopy, flexible; with removal of                            tumor(s), polyp(s), or other lesion(s) by snare                            technique                           [id number], 53, Colonoscopy, flexible; with biopsy,  single or multiple Diagnosis Code(s):        --- Professional ---                           D12.3, Benign neoplasm of transverse colon (hepatic                            flexure or splenic flexure)                           Z12.11, Encounter for screening for malignant                            neoplasm of colon CPT copyright 2016 [employer]. All rights reserved. The codes documented in this report are preliminary and upon coder review may  be revised to meet current compliance requirements. Earle Gell, MD Garlan Fair, MD 09/17/2016 10:23:30 AM This report has been signed electronically. Number of Addenda: 0

## 2016-09-17 NOTE — Transfer of Care (Signed)
Immediate Anesthesia Transfer of Care Note  Patient: Belinda Arellano  Procedure(s) Performed: Procedure(s): COLONOSCOPY WITH PROPOFOL (N/A)  Patient Location: Endoscopy Unit  Anesthesia Type:MAC  Level of Consciousness: awake  Airway & Oxygen Therapy: Patient Spontanous Breathing and Patient connected to face mask oxygen  Post-op Assessment: Report given to RN and Post -op Vital signs reviewed and stable  Post vital signs: Reviewed and stable  Last Vitals:  Vitals:   09/17/16 0924  BP: 137/88  Pulse: 72  Resp: (!) 21  Temp: 36.4 C    Last Pain:  Vitals:   09/17/16 0924  TempSrc: Oral         Complications: No apparent anesthesia complications

## 2016-09-17 NOTE — Discharge Instructions (Signed)
Colonoscopy, Adult, Care After  This sheet gives you information about how to care for yourself after your procedure. Your health care provider may also give you more specific instructions. If you have problems or questions, contact your health care provider.  What can I expect after the procedure?  After the procedure, it is common to have:  · A small amount of blood in your stool for 24 hours after the procedure.  · Some gas.  · Mild abdominal cramping or bloating.    Follow these instructions at home:  General instructions     · For the first 24 hours after the procedure:  ? Do not drive or use machinery.  ? Do not sign important documents.  ? Do not drink alcohol.  ? Do your regular daily activities at a slower pace than normal.  ? Eat soft, easy-to-digest foods.  ? Rest often.  · Take over-the-counter or prescription medicines only as told by your health care provider.  · It is up to you to get the results of your procedure. Ask your health care provider, or the department performing the procedure, when your results will be ready.  Relieving cramping and bloating   · Try walking around when you have cramps or feel bloated.  · Apply heat to your abdomen as told by your health care provider. Use a heat source that your health care provider recommends, such as a moist heat pack or a heating pad.  ? Place a towel between your skin and the heat source.  ? Leave the heat on for 20-30 minutes.  ? Remove the heat if your skin turns bright red. This is especially important if you are unable to feel pain, heat, or cold. You may have a greater risk of getting burned.  Eating and drinking   · Drink enough fluid to keep your urine clear or pale yellow.  · Resume your normal diet as instructed by your health care provider. Avoid heavy or fried foods that are hard to digest.  · Avoid drinking alcohol for as long as instructed by your health care provider.  Contact a health care provider if:  · You have blood in your stool 2-3  days after the procedure.  Get help right away if:  · You have more than a small spotting of blood in your stool.  · You pass large blood clots in your stool.  · Your abdomen is swollen.  · You have nausea or vomiting.  · You have a fever.  · You have increasing abdominal pain that is not relieved with medicine.  This information is not intended to replace advice given to you by your health care provider. Make sure you discuss any questions you have with your health care provider.  Document Released: 03/27/2004 Document Revised: 05/07/2016 Document Reviewed: 10/25/2015  [employer]vier Interactive Patient Education © 2017 [employer]

## 2016-09-17 NOTE — H&P (Signed)
Procedure: Screening colonoscopy. Normal screening colonoscopy was performed on 09/10/2006  History: The patient is a 61 year old female born 1956/01/07. She is scheduled to undergo a screening colonoscopy today.  Past medical history: Bilateral tubal ligation. Total abdominal hysterectomy. Right Achilles surgery. Osteoarthritis of the right knee. Cervical degenerative joint disease.  Medication allergies: Codeine causes nausea  Exam: The patient is alert and lying comfortably on the endoscopy stretcher. Abdomen is soft and nontender to palpation. Lungs are clear to auscultation. Cardiac exam reveals a regular rhythm.  Plan: Proceed with screening colonoscopy

## 2016-09-17 NOTE — Anesthesia Postprocedure Evaluation (Addendum)
Anesthesia Post Note  Patient: Belinda Arellano  Procedure(s) Performed: Procedure(s) (LRB): COLONOSCOPY WITH PROPOFOL (N/A)  Patient location during evaluation: Endoscopy Anesthesia Type: MAC Level of consciousness: awake and alert Pain management: pain level controlled Vital Signs Assessment: post-procedure vital signs reviewed and stable Respiratory status: spontaneous breathing and respiratory function stable Cardiovascular status: stable Anesthetic complications: no       Last Vitals:  Vitals:   09/17/16 1024 09/17/16 1025  BP:  (!) 108/42  Pulse: 78   Resp: 16   Temp: 36.4 C     Last Pain:  Vitals:   09/17/16 1024  TempSrc: Oral                 Karon Heckendorn DANIEL

## 2016-09-17 NOTE — Anesthesia Preprocedure Evaluation (Addendum)
Anesthesia Evaluation  Patient identified by MRN, date of birth, ID band Patient awake    Reviewed: Allergy & Precautions, H&P , NPO status , Patient's Chart, lab work & pertinent test results, reviewed documented beta blocker date and time   History of Anesthesia Complications Negative for: history of anesthetic complications  Airway Mallampati: II  TM Distance: >3 FB Neck ROM: full    Dental no notable dental hx. (+) Dental Advisory Given   Pulmonary neg pulmonary ROS,    Pulmonary exam normal breath sounds clear to auscultation       Cardiovascular negative cardio ROS   Rhythm:regular Rate:Normal     Neuro/Psych negative neurological ROS  negative psych ROS   GI/Hepatic negative GI ROS, Neg liver ROS,   Endo/Other  Morbid obesity  Renal/GU negative Renal ROS     Musculoskeletal   Abdominal   Peds  Hematology   Anesthesia Other Findings   Reproductive/Obstetrics                            Anesthesia Physical  Anesthesia Plan  ASA: III  Anesthesia Plan: MAC   Post-op Pain Management:    Induction:   Airway Management Planned: Natural Airway and Simple Face Mask  Additional Equipment:   Intra-op Plan:   Post-operative Plan:   Informed Consent: I have reviewed the patients History and Physical, chart, labs and discussed the procedure including the risks, benefits and alternatives for the proposed anesthesia with the patient or authorized representative who has indicated his/her understanding and acceptance.   Dental advisory given  Plan Discussed with: CRNA, Anesthesiologist and Surgeon  Anesthesia Plan Comments:       Anesthesia Quick Evaluation

## 2016-09-18 ENCOUNTER — Encounter (HOSPITAL_COMMUNITY): Payer: Self-pay | Admitting: Gastroenterology

## 2016-10-03 DIAGNOSIS — J0110 Acute frontal sinusitis, unspecified: Secondary | ICD-10-CM | POA: Diagnosis not present

## 2016-10-03 DIAGNOSIS — R05 Cough: Secondary | ICD-10-CM | POA: Diagnosis not present

## 2017-01-26 NOTE — Addendum Note (Signed)
Addendum  created 01/26/17 0829 by Favor Hackler, MD   Sign clinical note    

## 2017-03-13 DIAGNOSIS — H11153 Pinguecula, bilateral: Secondary | ICD-10-CM | POA: Diagnosis not present

## 2017-06-17 DIAGNOSIS — Z1231 Encounter for screening mammogram for malignant neoplasm of breast: Secondary | ICD-10-CM | POA: Diagnosis not present

## 2017-07-02 DIAGNOSIS — I10 Essential (primary) hypertension: Secondary | ICD-10-CM | POA: Diagnosis not present

## 2017-07-02 DIAGNOSIS — Z23 Encounter for immunization: Secondary | ICD-10-CM | POA: Diagnosis not present

## 2017-07-02 DIAGNOSIS — Z79899 Other long term (current) drug therapy: Secondary | ICD-10-CM | POA: Diagnosis not present

## 2017-07-02 DIAGNOSIS — Z0000 Encounter for general adult medical examination without abnormal findings: Secondary | ICD-10-CM | POA: Diagnosis not present

## 2018-01-10 DIAGNOSIS — H35463 Secondary vitreoretinal degeneration, bilateral: Secondary | ICD-10-CM | POA: Diagnosis not present

## 2018-06-04 DIAGNOSIS — L989 Disorder of the skin and subcutaneous tissue, unspecified: Secondary | ICD-10-CM | POA: Diagnosis not present

## 2018-06-04 DIAGNOSIS — Z23 Encounter for immunization: Secondary | ICD-10-CM | POA: Diagnosis not present

## 2018-06-04 DIAGNOSIS — I10 Essential (primary) hypertension: Secondary | ICD-10-CM | POA: Diagnosis not present

## 2018-06-20 DIAGNOSIS — Z1231 Encounter for screening mammogram for malignant neoplasm of breast: Secondary | ICD-10-CM | POA: Diagnosis not present

## 2018-07-15 DIAGNOSIS — I10 Essential (primary) hypertension: Secondary | ICD-10-CM | POA: Diagnosis not present

## 2018-07-15 DIAGNOSIS — Z79899 Other long term (current) drug therapy: Secondary | ICD-10-CM | POA: Diagnosis not present

## 2018-07-15 DIAGNOSIS — Z0000 Encounter for general adult medical examination without abnormal findings: Secondary | ICD-10-CM | POA: Diagnosis not present

## 2019-03-30 ENCOUNTER — Other Ambulatory Visit: Payer: BLUE CROSS/BLUE SHIELD

## 2019-03-30 ENCOUNTER — Other Ambulatory Visit: Payer: Self-pay

## 2019-03-30 DIAGNOSIS — Z20822 Contact with and (suspected) exposure to covid-19: Secondary | ICD-10-CM

## 2019-03-31 LAB — NOVEL CORONAVIRUS, NAA: SARS-CoV-2, NAA: NOT DETECTED

## 2020-10-25 DIAGNOSIS — K602 Anal fissure, unspecified: Secondary | ICD-10-CM

## 2020-10-25 HISTORY — DX: Anal fissure, unspecified: K60.2

## 2021-03-10 ENCOUNTER — Encounter: Payer: Self-pay | Admitting: Emergency Medicine

## 2021-03-10 ENCOUNTER — Ambulatory Visit
Admission: EM | Admit: 2021-03-10 | Discharge: 2021-03-10 | Disposition: A | Discharge status: Home / Self Care | Payer: No Typology Code available for payment source | Attending: Emergency Medicine | Admitting: Emergency Medicine

## 2021-03-10 ENCOUNTER — Other Ambulatory Visit: Payer: Self-pay

## 2021-03-10 DIAGNOSIS — N3001 Acute cystitis with hematuria: Secondary | ICD-10-CM | POA: Insufficient documentation

## 2021-03-10 HISTORY — DX: Essential (primary) hypertension: I10

## 2021-03-10 LAB — POCT URINALYSIS DIP (MANUAL ENTRY)
Glucose, UA: NEGATIVE mg/dL
Nitrite, UA: NEGATIVE
Protein Ur, POC: 30 mg/dL — AB
Spec Grav, UA: 1.015 (ref 1.010–1.025)
Urobilinogen, UA: 1 E.U./dL
pH, UA: 7.5 (ref 5.0–8.0)

## 2021-03-10 MED ORDER — NITROFURANTOIN MONOHYD MACRO 100 MG PO CAPS: 100.0000 mg | ORAL_CAPSULE | Freq: Two times a day (BID) | ORAL | 0 refills | Status: DC

## 2021-03-10 NOTE — ED Provider Notes (Signed)
[facility]   CC: Burning with urination  SUBJECTIVE:  Belinda Arellano is a 65 y.o. female who complains of vaginal irritation and itching x 3 days  Patient denies a precipitating event, recent sexual encounter, excessive caffeine intake.  Admits to increased magnesium intake for leg cramping.  Unsure if this is what caused symptoms.  Has tried cranberry juice without relief.  Symptoms are made worse with urination.  Admits to similar symptoms in the past with UTI.  Denies fever, chills, nausea, vomiting, abdominal pain, flank pain, abnormal vaginal discharge or bleeding, hematuria.    LMP: No LMP recorded. Patient has had a hysterectomy.  ROS: As in HPI.  All other pertinent ROS negative.     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   Current Outpatient Medications on File Prior to Encounter  Medication Sig Dispense Refill   amLODipine-atorvastatin (CADUET) 5-10 MG tablet Take 1 tablet by mouth daily.     atorvastatin (LIPITOR) 10 MG tablet Take 10 mg by mouth daily.     meloxicam (MOBIC) 15 MG tablet Take 15 mg by mouth daily as needed for pain.       Social History   Socioeconomic History   Marital status: Married    Spouse name: Not on file   Number of children: Not on file   Years of education: Not on file   Highest education level: Not on file  Occupational History   Not on file  Tobacco Use   Smoking status: Never   Smokeless tobacco: Never  Substance and Sexual Activity   Alcohol use: No   Drug use: No   Sexual activity: Not on file  Other Topics Concern   Not on file  Social History Narrative   Not on file   Social Determinants of Health   Financial Resource Strain: Not on file  Food Insecurity: Not on file  Transportation Needs: Not on file  Physical Activity: Not on file  Stress: Not on file  Social Connections: Not on file  Intimate Partner Violence: Not on file   History reviewed. No pertinent family history.  OBJECTIVE:  Vitals:   03/10/21 1308  BP: (!) 142/92  Pulse: 69  Resp: 16  Temp: 97.9 F (36.6 C)  TempSrc: Oral  SpO2: 96%   General appearance: AOx3 in no acute distress HEENT: NCAT.  Oropharynx clear.  Lungs: clear to auscultation bilaterally without adventitious breath sounds Heart: regular rate and rhythm.   Abdomen: soft; non-distended; no tenderness; bowel sounds present; no guarding Back: no CVA tenderness Extremities: no edema; symmetrical with no gross deformities Skin: warm and dry  Neurologic: Ambulates from chair to exam table without difficulty Psychological: alert and cooperative; normal mood and affect  Labs Reviewed  POCT URINALYSIS DIP (MANUAL ENTRY) - Abnormal; Notable for the following components:      Result Value   Bilirubin, UA small (*)    Ketones, POC UA trace (5) (*)    Blood, UA trace-intact (*)    Protein Ur, POC =30 (*)    Leukocytes, UA Large (3+) (*)    All other components within normal limits  URINE CULTURE    ASSESSMENT & PLAN:  1. Acute cystitis with hematuria     Meds ordered this encounter  Medications   nitrofurantoin,  macrocrystal-monohydrate, (MACROBID) 100 MG capsule    Sig: Take 1 capsule (100 mg total) by mouth 2 (two) times daily.    Dispense:  10 capsule    Refill:  0    Order Specific Question:   Supervising Provider    Answer:   Raylene Everts [[id number]]   Urine concerning for UTI Urine culture sent.  We will call you with the results.   Push fluids and get plenty of rest.   Take antibiotic as directed and to completion Follow up with PCP if symptoms persists Return here or go to ER if you have any new or worsening symptoms such as fever, worsening abdominal pain, nausea/vomiting, flank pain, etc...  Outlined signs and symptoms indicating need for more acute intervention. Patient verbalized understanding. After Visit Summary given.      Lestine Box, PA-C 03/10/21 1343

## 2021-03-10 NOTE — ED Triage Notes (Signed)
States she is also having pain in her lower back and upper legs x a few months.

## 2021-03-10 NOTE — Discharge Instructions (Addendum)
Urine concerning for UTI Urine culture sent.  We will call you with the results.   Push fluids and get plenty of rest.   Take antibiotic as directed and to completion Follow up with PCP if symptoms persists Return here or go to ER if you have any new or worsening symptoms such as fever, worsening abdominal pain, nausea/vomiting, flank pain, etc... 

## 2021-03-10 NOTE — ED Triage Notes (Signed)
Vaginal irritation x 3 days.

## 2021-03-12 LAB — URINE CULTURE: Culture: <10000 — AB

## 2021-05-08 ENCOUNTER — Ambulatory Visit: Payer: Self-pay | Admitting: General Surgery

## 2021-05-08 NOTE — H&P (Signed)
  PROVIDER:  Monico Blitz, MD  MRN: [id number] DOB: 04/08/1956 DATE OF ENCOUNTER: 05/08/2021 Subjective   Chief Complaint: Rectal Pain     History of Present Illness: The patient is a 65 year old female who presented to the office in March 2022 with an anal fissure. She stated that in the previous November she had foot surgery requiring the use of narcotics.  This caused constipation, which then caused a fissure.  She was treated with a 3 week course of nifedipine ointment, which she said helped initially.  Her last colonoscopy was in 2018.  I recommended an 8-week course of nifedipine treatment.  She completed this and her symptoms resolved but then recurred over the last month.  She has restarted the nifedipine ointment.  She is having soft bowel movements.  She occasionally has some blood noted on the toilet paper.  Past Medical History:  Diagnosis Date   Arthritis    Hypertension    Past Surgical History:  Procedure Laterality Date   HYSTERECTOMY     Family History  Problem Relation Age of Onset   High blood pressure (Hypertension) Mother    Hyperlipidemia (Elevated cholesterol) Mother    Social History   Socioeconomic History   Marital status: Married  Tobacco Use   Smoking status: Never Smoker   Smokeless tobacco: Never Used  Substance and Sexual Activity   Alcohol use: Defer   Drug use: Defer   Sexual activity: Defer  Review of Systems - Negative except rectal pain and bleeding Current Outpatient Medications on File Prior to Visit  Medication Sig Dispense Refill   amLODIPine (NORVASC) 2.5 MG tablet amlodipine 2.5 mg tablet     meloxicam (MOBIC) 15 MG tablet Take by mouth     No current facility-administered medications on file prior to visit.   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  She also has an associated skin tag.  I have recommended chemical sphincterotomy with fissurectomy and excision of skin tag.  I think this will have the most likely chance to heal her fissure.  We also discussed performing a permanent sphincterotomy.  We decided to leave this as a last resort if her fissure does not heal with this attempt.  Discussed the risk of the procedure including pain, bleeding, recurrence and need for additional surgery.      Rosario Adie, MD Colon and Rectal Surgery [facility]

## 2021-05-16 ENCOUNTER — Other Ambulatory Visit: Payer: Self-pay | Admitting: Geriatric Medicine

## 2021-05-16 ENCOUNTER — Ambulatory Visit
Admission: RE | Admit: 2021-05-16 | Discharge: 2021-05-16 | Disposition: A | Discharge status: Home / Self Care | Payer: BLUE CROSS/BLUE SHIELD | Source: Ambulatory Visit | Attending: Geriatric Medicine | Admitting: Geriatric Medicine

## 2021-05-16 DIAGNOSIS — M542 Cervicalgia: Secondary | ICD-10-CM

## 2021-06-05 ENCOUNTER — Other Ambulatory Visit: Payer: Self-pay | Admitting: Geriatric Medicine

## 2021-06-05 DIAGNOSIS — M542 Cervicalgia: Secondary | ICD-10-CM

## 2021-06-15 ENCOUNTER — Encounter (HOSPITAL_BASED_OUTPATIENT_CLINIC_OR_DEPARTMENT_OTHER): Payer: Self-pay | Admitting: General Surgery

## 2021-06-16 ENCOUNTER — Other Ambulatory Visit: Payer: Self-pay

## 2021-06-16 ENCOUNTER — Encounter (HOSPITAL_BASED_OUTPATIENT_CLINIC_OR_DEPARTMENT_OTHER): Payer: Self-pay | Admitting: General Surgery

## 2021-06-16 ENCOUNTER — Ambulatory Visit
Admission: RE | Admit: 2021-06-16 | Discharge: 2021-06-16 | Disposition: A | Discharge status: Home / Self Care | Payer: No Typology Code available for payment source | Source: Ambulatory Visit | Attending: Geriatric Medicine | Admitting: Geriatric Medicine

## 2021-06-16 DIAGNOSIS — Z973 Presence of spectacles and contact lenses: Secondary | ICD-10-CM

## 2021-06-16 DIAGNOSIS — Z972 Presence of dental prosthetic device (complete) (partial): Secondary | ICD-10-CM

## 2021-06-16 DIAGNOSIS — M542 Cervicalgia: Secondary | ICD-10-CM

## 2021-06-16 DIAGNOSIS — M1990 Unspecified osteoarthritis, unspecified site: Secondary | ICD-10-CM

## 2021-06-16 HISTORY — DX: Presence of spectacles and contact lenses: Z97.3

## 2021-06-16 HISTORY — DX: Unspecified osteoarthritis, unspecified site: M19.90

## 2021-06-16 HISTORY — DX: Presence of dental prosthetic device (complete) (partial): Z97.2

## 2021-06-16 NOTE — Progress Notes (Signed)
Spoke w/ via phone for pre-op interview---patient Lab needs dos---- ISTAT, EKG              Lab results------none COVID test -----patient states asymptomatic no test needed Arrive at -------0645 on 06/22/21 NPO after MN NO Solid Food.  Clear liquids from MN until---0545 Med rec completed Medications to take morning of surgery -----none Diabetic medication -----n/a Patient instructed no nail polish to be worn day of surgery Patient instructed to bring photo id and insurance card day of surgery Patient aware to have Driver (ride ) / caregiver    for 24 hours after surgery - husband [facility] Patient Special Instructions -----none Pre-Op special Istructions -----none Patient verbalized understanding of instructions that were given at this phone interview. Patient denies shortness of breath, chest pain, fever, cough at this phone interview.

## 2021-06-21 NOTE — Anesthesia Preprocedure Evaluation (Addendum)
Anesthesia Evaluation  Patient identified by MRN, date of birth, ID band Patient awake    Reviewed: Allergy & Precautions, H&P , NPO status , Patient's Chart, lab work & pertinent test results  History of Anesthesia Complications Negative for: history of anesthetic complications  Airway Mallampati: II  TM Distance: >3 FB Neck ROM: Full    Dental no notable dental hx. (+) Dental Advisory Given   Pulmonary neg pulmonary ROS,    Pulmonary exam normal        Cardiovascular hypertension, Normal cardiovascular exam     Neuro/Psych negative neurological ROS  negative psych ROS   GI/Hepatic negative GI ROS, Neg liver ROS,   Endo/Other  Morbid obesity  Renal/GU negative Renal ROS     Musculoskeletal   Abdominal   Peds  Hematology   Anesthesia Other Findings   Reproductive/Obstetrics                            Anesthesia Physical  Anesthesia Plan  ASA: 3  Anesthesia Plan: MAC   Post-op Pain Management:    Induction:   PONV Risk Score and Plan: 3 and Ondansetron, Dexamethasone and Midazolam  Airway Management Planned: Natural Airway  Additional Equipment:   Intra-op Plan:   Post-operative Plan:   Informed Consent: I have reviewed the patients History and Physical, chart, labs and discussed the procedure including the risks, benefits and alternatives for the proposed anesthesia with the patient or authorized representative who has indicated his/her understanding and acceptance.     Dental advisory given  Plan Discussed with: Anesthesiologist and CRNA  Anesthesia Plan Comments:      Anesthesia Quick Evaluation

## 2021-06-21 NOTE — Progress Notes (Signed)
Called patient to come in at 0600 instead of 0645

## 2021-06-22 ENCOUNTER — Ambulatory Visit (HOSPITAL_BASED_OUTPATIENT_CLINIC_OR_DEPARTMENT_OTHER)
Admission: RE | Admit: 2021-06-22 | Discharge: 2021-06-22 | Disposition: A | Discharge status: Home / Self Care | Payer: No Typology Code available for payment source | Attending: General Surgery | Admitting: General Surgery

## 2021-06-22 ENCOUNTER — Encounter (HOSPITAL_BASED_OUTPATIENT_CLINIC_OR_DEPARTMENT_OTHER)
Admission: RE | Disposition: A | Discharge status: Home / Self Care | Payer: Self-pay | Source: Home / Self Care | Attending: General Surgery

## 2021-06-22 ENCOUNTER — Ambulatory Visit (HOSPITAL_BASED_OUTPATIENT_CLINIC_OR_DEPARTMENT_OTHER): Payer: No Typology Code available for payment source | Admitting: Anesthesiology

## 2021-06-22 ENCOUNTER — Encounter (HOSPITAL_BASED_OUTPATIENT_CLINIC_OR_DEPARTMENT_OTHER): Payer: Self-pay | Admitting: General Surgery

## 2021-06-22 DIAGNOSIS — Z79899 Other long term (current) drug therapy: Secondary | ICD-10-CM | POA: Insufficient documentation

## 2021-06-22 DIAGNOSIS — Z791 Long term (current) use of non-steroidal anti-inflammatories (NSAID): Secondary | ICD-10-CM | POA: Insufficient documentation

## 2021-06-22 DIAGNOSIS — K602 Anal fissure, unspecified: Secondary | ICD-10-CM | POA: Diagnosis not present

## 2021-06-22 DIAGNOSIS — Z885 Allergy status to narcotic agent status: Secondary | ICD-10-CM | POA: Insufficient documentation

## 2021-06-22 DIAGNOSIS — K644 Residual hemorrhoidal skin tags: Secondary | ICD-10-CM | POA: Diagnosis not present

## 2021-06-22 HISTORY — PX: SPHINCTEROTOMY: SHX5279

## 2021-06-22 HISTORY — PX: EXCISION OF SKIN TAG: SHX6270

## 2021-06-22 LAB — POCT I-STAT, CHEM 8
BUN: 10 mg/dL (ref 8–23)
Calcium, Ion: 1.17 mmol/L (ref 1.15–1.40)
Chloride: 105 mmol/L (ref 98–111)
Creatinine, Ser: 0.7 mg/dL (ref 0.44–1.00)
Glucose, Bld: 113 mg/dL — ABNORMAL HIGH (ref 70–99)
HCT: 43 % (ref 36.0–46.0)
Hemoglobin: 14.6 g/dL (ref 12.0–15.0)
Potassium: 3.5 mmol/L (ref 3.5–5.1)
Sodium: 143 mmol/L (ref 135–145)
TCO2: 26 mmol/L (ref 22–32)

## 2021-06-22 SURGERY — SPHINCTEROTOMY, ANAL
Anesthesia: Monitor Anesthesia Care | Site: Rectum

## 2021-06-22 MED ORDER — CELECOXIB 200 MG PO CAPS
ORAL_CAPSULE | ORAL | Status: AC
  Filled 2021-06-22: qty 1

## 2021-06-22 MED ORDER — ACETAMINOPHEN 500 MG PO TABS
ORAL_TABLET | ORAL | Status: AC
  Filled 2021-06-22: qty 2

## 2021-06-22 MED ORDER — MIDAZOLAM HCL 5 MG/5ML IJ SOLN
INTRAMUSCULAR | Status: DC | PRN
  Administered 2021-06-22: 2 mg via INTRAVENOUS

## 2021-06-22 MED ORDER — ACETAMINOPHEN 325 MG PO TABS: 650.0000 mg | ORAL_TABLET | ORAL | Status: DC | PRN

## 2021-06-22 MED ORDER — SODIUM CHLORIDE 0.9% FLUSH: 3.0000 mL | INTRAVENOUS | Status: DC | PRN

## 2021-06-22 MED ORDER — ONDANSETRON HCL 4 MG/2ML IJ SOLN
INTRAMUSCULAR | Status: DC | PRN
  Administered 2021-06-22: 4 mg via INTRAVENOUS

## 2021-06-22 MED ORDER — ONABOTULINUMTOXINA 100 UNITS IJ SOLR
INTRAMUSCULAR | Status: DC | PRN
  Administered 2021-06-22: 86 [IU]

## 2021-06-22 MED ORDER — AMISULPRIDE (ANTIEMETIC) 5 MG/2ML IV SOLN: 10.0000 mg | Freq: Once | INTRAVENOUS | Status: DC | PRN

## 2021-06-22 MED ORDER — PROPOFOL 10 MG/ML IV BOLUS
INTRAVENOUS | Status: AC
  Filled 2021-06-22: qty 20

## 2021-06-22 MED ORDER — SODIUM CHLORIDE 0.9% FLUSH: 3.0000 mL | Freq: Two times a day (BID) | INTRAVENOUS | Status: DC

## 2021-06-22 MED ORDER — FENTANYL CITRATE (PF) 100 MCG/2ML IJ SOLN
INTRAMUSCULAR | Status: DC | PRN
  Administered 2021-06-22: 50 ug via INTRAVENOUS

## 2021-06-22 MED ORDER — FENTANYL CITRATE (PF) 100 MCG/2ML IJ SOLN: 25.0000 ug | INTRAMUSCULAR | Status: DC | PRN

## 2021-06-22 MED ORDER — SODIUM CHLORIDE 0.9 % IV SOLN: 250.0000 mL | INTRAVENOUS | Status: DC | PRN

## 2021-06-22 MED ORDER — PROPOFOL 500 MG/50ML IV EMUL
INTRAVENOUS | Status: DC | PRN
  Administered 2021-06-22: 100 ug/kg/min via INTRAVENOUS

## 2021-06-22 MED ORDER — ACETAMINOPHEN 325 MG RE SUPP: 650.0000 mg | RECTAL | Status: DC | PRN

## 2021-06-22 MED ORDER — ACETAMINOPHEN 500 MG PO TABS
1000.0000 mg | ORAL_TABLET | ORAL | Status: AC
  Administered 2021-06-22: 1000 mg via ORAL

## 2021-06-22 MED ORDER — FENTANYL CITRATE (PF) 100 MCG/2ML IJ SOLN
INTRAMUSCULAR | Status: AC
  Filled 2021-06-22: qty 2

## 2021-06-22 MED ORDER — 0.9 % SODIUM CHLORIDE (POUR BTL) OPTIME
TOPICAL | Status: DC | PRN
  Administered 2021-06-22: 500 mL

## 2021-06-22 MED ORDER — OXYCODONE HCL 5 MG PO TABS: 5.0000 mg | ORAL_TABLET | ORAL | Status: DC | PRN

## 2021-06-22 MED ORDER — CELECOXIB 200 MG PO CAPS
200.0000 mg | ORAL_CAPSULE | Freq: Once | ORAL | Status: AC
  Administered 2021-06-22: 200 mg via ORAL

## 2021-06-22 MED ORDER — PROPOFOL 10 MG/ML IV BOLUS
INTRAVENOUS | Status: DC | PRN
  Administered 2021-06-22: 20 mg via INTRAVENOUS

## 2021-06-22 MED ORDER — SODIUM CHLORIDE (PF) 0.9 % IJ SOLN
INTRAMUSCULAR | Status: DC | PRN
  Administered 2021-06-22: 43 mL

## 2021-06-22 MED ORDER — TRAMADOL HCL 50 MG PO TABS: 50.0000 mg | ORAL_TABLET | Freq: Four times a day (QID) | ORAL | 0 refills | Status: DC | PRN

## 2021-06-22 MED ORDER — LACTATED RINGERS IV SOLN: INTRAVENOUS | Status: DC

## 2021-06-22 MED ORDER — BUPIVACAINE-EPINEPHRINE 0.5% -1:200000 IJ SOLN
INTRAMUSCULAR | Status: DC | PRN
  Administered 2021-06-22: 10 mL

## 2021-06-22 MED ORDER — PROMETHAZINE HCL 25 MG/ML IJ SOLN: 6.2500 mg | INTRAMUSCULAR | Status: DC | PRN

## 2021-06-22 MED ORDER — MIDAZOLAM HCL 2 MG/2ML IJ SOLN
INTRAMUSCULAR | Status: AC
  Filled 2021-06-22: qty 2

## 2021-06-22 SURGICAL SUPPLY — 33 items
APL SKNCLS STERI-STRIP NONHPOA (GAUZE/BANDAGES/DRESSINGS) ×2
BENZOIN TINCTURE PRP APPL 2/3 (GAUZE/BANDAGES/DRESSINGS) ×4 IMPLANT
BLADE SURG 15 STRL LF DISP TIS (BLADE) ×1 IMPLANT
BLADE SURG 15 STRL SS (BLADE) ×2
COVER BACK TABLE 60X90IN (DRAPES) ×2 IMPLANT
COVER MAYO STAND STRL (DRAPES) ×2 IMPLANT
DECANTER SPIKE VIAL GLASS SM (MISCELLANEOUS) ×2 IMPLANT
DRAPE LAPAROTOMY 100X72 PEDS (DRAPES) ×2 IMPLANT
DRAPE UTILITY XL STRL (DRAPES) ×2 IMPLANT
DRSG PAD ABDOMINAL 8X10 ST (GAUZE/BANDAGES/DRESSINGS) ×2 IMPLANT
ELECT REM PT RETURN 9FT ADLT (ELECTROSURGICAL) ×2
ELECTRODE REM PT RTRN 9FT ADLT (ELECTROSURGICAL) ×1 IMPLANT
GAUZE 4X4 16PLY ~~LOC~~+RFID DBL (SPONGE) ×2 IMPLANT
GLOVE SURG POLYISO LF SZ6 (GLOVE) ×2 IMPLANT
GLOVE SURG POLYISO LF SZ6.5 (GLOVE) ×2 IMPLANT
GLOVE SURG UNDER POLY LF SZ6 (GLOVE) ×2 IMPLANT
GLOVE SURG UNDER POLY LF SZ6.5 (GLOVE) ×2 IMPLANT
GLOVE SURG UNDER POLY LF SZ7 (GLOVE) ×4 IMPLANT
GOWN STRL REUS W/TWL LRG LVL3 (GOWN DISPOSABLE) ×4 IMPLANT
GOWN STRL REUS W/TWL XL LVL3 (GOWN DISPOSABLE) ×2 IMPLANT
HYDROGEN PEROXIDE 16OZ (MISCELLANEOUS) ×2 IMPLANT
IV CATH 14GX2 1/4 (CATHETERS) ×2 IMPLANT
IV CATH 18G SAFETY (IV SOLUTION) ×2 IMPLANT
KIT TURNOVER CYSTO (KITS) ×2 IMPLANT
NEEDLE HYPO 22GX1.5 SAFETY (NEEDLE) ×2 IMPLANT
NS IRRIG 500ML POUR BTL (IV SOLUTION) ×2 IMPLANT
PACK BASIN DAY SURGERY FS (CUSTOM PROCEDURE TRAY) ×2 IMPLANT
PAD ARMBOARD 7.5X6 YLW CONV (MISCELLANEOUS) ×2 IMPLANT
SUT CHROMIC 2 0 SH (SUTURE) ×4 IMPLANT
SYR CONTROL 10ML LL (SYRINGE) ×2 IMPLANT
TOWEL OR 17X26 10 PK STRL BLUE (TOWEL DISPOSABLE) ×2 IMPLANT
TRAY DSU PREP LF (CUSTOM PROCEDURE TRAY) ×2 IMPLANT
UNDERPAD 30X36 HEAVY ABSORB (UNDERPADS AND DIAPERS) ×2 IMPLANT

## 2021-06-22 NOTE — Discharge Instructions (Addendum)
No acetaminophen/Tylenol until after 12:30pm today if needed for pain.   No ibuprofen, Advil, Aleve, Motrin, ketorolac, meloxicam, naproxen, or other NSAIDS until after 12:30pm today if needed for pain.     Patient has Meloxicam and Aleve on home medication record. It is important for patient to know not to take both medications simultaneously.            ANORECTAL SURGERY: POST OP INSTRUCTIONS Take your usually prescribed home medications unless otherwise directed. DIET: During the first few hours after surgery sip on some liquids until you are able to urinate.  It is normal to not urinate for several hours after this surgery.  If you feel uncomfortable, please contact the office for instructions.  After you are able to urinate,you may eat, if you feel like it.  Follow a light bland diet the first 24 hours after arrival home, such as soup, liquids, crackers, etc.  Be sure to include lots of fluids daily (6-8 glasses).  Avoid fast food or heavy meals, as your are more likely to get nauseated.  Eat a low fat diet the next few days after surgery.  Limit caffeine intake to 1-2 servings a day. PAIN CONTROL: Pain is best controlled by a usual combination of several different methods TOGETHER: Muscle relaxation: Soak in a warm bath (or Sitz bath) three times a day and after bowel movements.  Continue to do this until all pain is resolved. Over the counter pain medication Prescription pain medication Most patients will experience some swelling and discomfort in the anus/rectal area and incisions.  Heat such as warm towels, sitz baths, warm baths, etc to help relax tight/sore spots and speed recovery.  Some people prefer to use ice, especially in the first couple days after surgery, as it may decrease the pain and swelling, or alternate between ice & heat.  Experiment to what works for you.  Swelling and bruising can take several weeks to resolve.  Pain can take even longer to completely  resolve. It is helpful to take an over-the-counter pain medication regularly for the first few weeks.  Choose one of the following that works best for you: Naproxen (Aleve, etc)  Two 220mg  tabs twice a day Ibuprofen (Advil, etc) Three 200mg  tabs four times a day (every meal & bedtime) A  prescription for pain medication (such as percocet, oxycodone, hydrocodone, etc) should be given to you upon discharge.  Take your pain medication as prescribed.  If you are having problems/concerns with the prescription medicine (does not control pain, nausea, vomiting, rash, itching, etc), please call us [phone] to see if we need to switch you to a different pain medicine that will work better for you and/or control your side effect better. If you need a refill on your pain medication, please contact your pharmacy.  They will contact our office to request authorization. Prescriptions will not be filled after 5 pm or on week-ends. KEEP YOUR BOWELS REGULAR and AVOID CONSTIPATION The goal is one to two soft bowel movements a day.  You should at least have a bowel movement every other day. Avoid getting constipated.  Between the surgery and the pain medications, it is common to experience some constipation. This can be very painful after rectal surgery.  Increasing fluid intake and taking a fiber supplement (such as Metamucil, Citrucel, FiberCon, etc) 1-2 times a day regularly will usually help prevent this problem from occurring.  A stool softener like colace is also recommended.  This can be purchased  over the counter at your pharmacy.  You can take it up to 3 times a day.  If you do not have a bowel movement after 24 hrs since your surgery, take one does of milk of magnesia.  If you still haven't had a bowel movement 8-12 hours after that dose, take another dose.  If you don't have a bowel movement 48 hrs after surgery, purchase a Fleets enema from the drug store and administer gently per package instructions.  If  you still are having trouble with your bowel movements after that, please call the office for further instructions. If you develop diarrhea or have many loose bowel movements, simplify your diet to bland foods & liquids for a few days.  Stop any stool softeners and decrease your fiber supplement.  Switching to mild anti-diarrheal medications (Kayopectate, Pepto Bismol) can help.  If this worsens or does not improve, please call us.  Wound Care Remove your bandages before your first bowel movement or 8 hours after surgery.     Remove any wound packing material at this tim,e as well.  You do not need to repack the wound unless instructed otherwise.  Wear an absorbent pad or soft cotton gauze in your underwear to catch any drainage and help keep the area clean. You should change this every 2-3 hours while awake. Keep the area clean and dry.  Bathe / shower every day, especially after bowel movements.  Keep the area clean by showering / bathing over the incision / wound.   It is okay to soak an open wound to help wash it.  Wet wipes or showers / gentle washing after bowel movements is often less traumatic than regular toilet paper. You may have some styrofoam-like soft packing in the rectum which will come out with the first bowel movement.  You will often notice bleeding with bowel movements.  This should slow down by the end of the first week of surgery Expect some drainage.  This should slow down, too, by the end of the first week of surgery.  Wear an absorbent pad or soft cotton gauze in your underwear until the drainage stops. Do Not sit on a rubber or pillow ring.  This can make you symptoms worse.  You may sit on a soft pillow if needed.  ACTIVITIES as tolerated:   You may resume regular (light) daily activities beginning the next day--such as daily self-care, walking, climbing stairs--gradually increasing activities as tolerated.  If you can walk 30 minutes without difficulty, it is safe to try more  intense activity such as jogging, treadmill, bicycling, low-impact aerobics, swimming, etc. Save the most intensive and strenuous activity for last such as sit-ups, heavy lifting, contact sports, etc  Refrain from any heavy lifting or straining until you are off narcotics for pain control.   You may drive when you are no longer taking prescription pain medication, you can comfortably sit for long periods of time, and you can safely maneuver your car and apply brakes. You may have sexual intercourse when it is comfortable.  FOLLOW UP in our office Please call [facility] at [phone] to set up an appointment to see your surgeon in the office for a follow-up appointment approximately 3-4 weeks after your surgery. Make sure that you call for this appointment the day you arrive home to insure a convenient appointment time. 10. IF YOU HAVE DISABILITY OR FAMILY LEAVE FORMS, BRING THEM TO THE OFFICE FOR PROCESSING.  DO NOT GIVE THEM TO YOUR  DOCTOR.     WHEN TO CALL us [phone]: Poor pain control Reactions / problems with new medications (rash/itching, nausea, etc)  Fever over 101.5 F (38.5 C) Inability to urinate Nausea and/or vomiting Worsening swelling or bruising Continued bleeding from incision. Increased pain, redness, or drainage from the incision  The clinic staff is available to answer your questions during regular business hours (8:30am-5pm).  Please don't hesitate to call and ask to speak to one of our nurses for clinical concerns.   A surgeon from [facility] is always on call at the hospitals   If you have a medical emergency, go to the nearest emergency room or call 911.    [facility], [place], [place], [place], [place]  [address] ? MAIN: [phone] ? TOLL FREE: [phone] ? FAX [phone] [email or web address]         Post Anesthesia Home Care Instructions  Activity: Get plenty of rest for the  remainder of the day. A responsible individual must stay with you for 24 hours following the procedure.  For the next 24 hours, DO NOT: -Drive a car -Paediatric nurse -Drink alcoholic beverages -Take any medication unless instructed by your physician -Make any legal decisions or sign important papers.  Meals: Start with liquid foods such as gelatin or soup. Progress to regular foods as tolerated. Avoid greasy, spicy, heavy foods. If nausea and/or vomiting occur, drink only clear liquids until the nausea and/or vomiting subsides. Call your physician if vomiting continues.  Special Instructions/Symptoms: Your throat may feel dry or sore from the anesthesia or the breathing tube placed in your throat during surgery. If this causes discomfort, gargle with warm salt water. The discomfort should disappear within 24 hours.

## 2021-06-22 NOTE — Transfer of Care (Signed)
Immediate Anesthesia Transfer of Care Note  Patient: Belinda Arellano  Procedure(s) Performed: CHEMICAL SPHINCTEROTOMY (Rectum) EXAM UNDER ANESTHESIA (Rectum) EXCISION OF SKIN TAG (Rectum)  Patient Location: PACU  Anesthesia Type:MAC  Level of Consciousness: awake, alert , oriented and patient cooperative  Airway & Oxygen Therapy: Patient Spontanous Breathing and Patient connected to face mask oxygen  Post-op Assessment: Report given to RN and Post -op Vital signs reviewed and stable  Post vital signs: Reviewed and stable  Last Vitals:  Vitals Value Taken Time  BP 119/72 06/22/21 0842  Temp    Pulse 77 06/22/21 0843  Resp 20 06/22/21 0843  SpO2 98 % 06/22/21 0843  Vitals shown include unvalidated device data.  Last Pain:  Vitals:   06/22/21 0628  TempSrc: Oral  PainSc: 0-No pain      Patients Stated Pain Goal: 6 (04/88/89 1694)  Complications: No notable events documented.

## 2021-06-22 NOTE — H&P (Signed)
  History of Present Illness: The patient is a 65 year old female who presented to the office in March 2022 with an anal fissure. She stated that in the previous November she had foot surgery requiring the use of narcotics.  This caused constipation, which then caused a fissure.  She was treated with a 3 week course of nifedipine ointment, which she said helped initially.  Her last colonoscopy was in 2018.  I recommended an 8-week course of nifedipine treatment.  She completed this and her symptoms resolved but then recurred over the last month.  She has restarted the nifedipine ointment.  She is having soft bowel movements.  She occasionally has some blood noted on the toilet paper.       Past Medical History:  Diagnosis Date   Arthritis     Hypertension           Past Surgical History:  Procedure Laterality Date   HYSTERECTOMY             Family History  Problem Relation Age of Onset   High blood pressure (Hypertension) Mother     Hyperlipidemia (Elevated cholesterol) Mother      Social History        Socioeconomic History   Marital status: Married  Tobacco Use   Smoking status: Never Smoker   Smokeless tobacco: Never Used  Substance and Sexual Activity   Alcohol use: Defer   Drug use: Defer   Sexual activity: Defer  Review of Systems - Negative except rectal pain and bleeding       Current Outpatient Medications on File Prior to Visit  Medication Sig Dispense Refill   amLODIPine (NORVASC) 2.5 MG tablet amlodipine 2.5 mg tablet       meloxicam (MOBIC) 15 MG tablet Take by mouth        No current facility-administered medications on file prior to visit.        Allergies  Allergen Reactions   Codeine Itching and Rash    Objective:    Today's Vitals   06/16/21 0929 06/22/21 0628  BP:  (!) 163/112  Pulse:  90  Resp:  17  Temp:  98.3 F (36.8 C)  TempSrc:  Oral  SpO2:  99%  Weight: 111.1 kg 110.6 kg  Height: 5' 1.5" (1.562 m) 5' 1.5" (1.562 m)  PainSc:  0-No  pain   Body mass index is 45.34 kg/m.    General appearance - alert, well appearing, and in no distress CV: RRR Lungs: CTA Abd: soft rectal: anal fissure with associated skin tag     Labs, Imaging and Diagnostic Testing:     Assessment and Plan:  Diagnoses and all orders for this visit:   Anal fissure       65 year old female who presents to the office with an anal fissure that is recurrent.  She also has an associated skin tag.  I have recommended chemical sphincterotomy with fissurectomy and excision of skin tag.  I think this will have the most likely chance to heal her fissure.  We also discussed performing a permanent sphincterotomy.  We decided to leave this as a last resort if her fissure does not heal with this attempt.  Discussed the risk of the procedure including pain, bleeding, recurrence and need for additional surgery.

## 2021-06-22 NOTE — Anesthesia Postprocedure Evaluation (Signed)
Anesthesia Post Note  Patient: Belinda Arellano  Procedure(s) Performed: CHEMICAL SPHINCTEROTOMY (Rectum) EXAM UNDER ANESTHESIA (Rectum) EXCISION OF SKIN TAG (Rectum)     Patient location during evaluation: PACU Anesthesia Type: MAC Level of consciousness: awake and alert Pain management: pain level controlled Vital Signs Assessment: post-procedure vital signs reviewed and stable Respiratory status: spontaneous breathing and respiratory function stable Cardiovascular status: stable Postop Assessment: no apparent nausea or vomiting Anesthetic complications: no   No notable events documented.  Last Vitals:  Vitals:   06/22/21 0930 06/22/21 1016  BP: 137/85 (!) 149/93  Pulse: 63 64  Resp: 16 18  Temp:    SpO2: 96% 100%    Last Pain:  Vitals:   06/22/21 1016  TempSrc:   PainSc: 0-No pain                 Antonetta Clanton DANIEL

## 2021-06-22 NOTE — Op Note (Signed)
06/22/2021  8:35 AM  PATIENT:  Belinda Arellano  65 y.o. female  Patient Care Team: Lajean Manes, MD as PCP - General (Internal Medicine)  PRE-OPERATIVE DIAGNOSIS:  ANAL FISSURE  POST-OPERATIVE DIAGNOSIS:  ANAL FISSURE  PROCEDURE:  CHEMICAL SPHINCTEROTOMY EXAM UNDER ANESTHESIA EXCISION OF SKIN TAG    Surgeon(s): Leighton Ruff, MD  ASSISTANT: none   ANESTHESIA:   local and MAC  SPECIMEN:  No Specimen  DISPOSITION OF SPECIMEN:  PATHOLOGY  COUNTS:  YES  PLAN OF CARE: Discharge to home after PACU  PATIENT DISPOSITION:  PACU - hemodynamically stable.  INDICATION: 65 y.o. F with recurrent anal fissures and associated skin tag   OR FINDINGS: moderate sphincter hypertension with posterior midline skin tag that appears to be obstructing healing.  DESCRIPTION: the patient was identified in the preoperative holding area and taken to the OR where they were laid on the operating room table.  MAC anesthesia was induced without difficulty. The patient was then positioned in prone jackknife position with buttocks gently taped apart.  The patient was then prepped and draped in usual sterile fashion.  SCDs were noted to be in place prior to the initiation of anesthesia. A surgical timeout was performed indicating the correct patient, procedure, positioning and need for preoperative antibiotics.  A rectal block was performed using Marcaine with epinephrine.    I began with a digital rectal exam.  There was moderate sphincter hypertension noted.  This easily dilated to 3 fingerbreadths.  I then placed a Hill-Ferguson anoscope into the anal canal and evaluated this completely.  There was minimal hemorrhoid disease.  There was a posterior midline anal fissure noted with a skin tag which appears to flap over the fissure.  I began by excising the skin tag using Metzenbaum scissors.  I excised the edge of the fissure as well to speed healing.  The skin tag excision was closed using interrupted 2-0  chromic sutures.  I then injected 86 units of Botox mixed with saline into the intersphincteric groove circumferentially to perform the chemical sphincterotomy.  Once this was complete a dressing was applied.  The patient was then awakened from anesthesia and sent to the postanesthesia care unit in stable condition.  All counts were correct per operating room staff.

## 2021-06-23 ENCOUNTER — Encounter (HOSPITAL_BASED_OUTPATIENT_CLINIC_OR_DEPARTMENT_OTHER): Payer: Self-pay | Admitting: General Surgery

## 2021-11-30 ENCOUNTER — Other Ambulatory Visit: Payer: Self-pay | Admitting: Geriatric Medicine

## 2021-11-30 DIAGNOSIS — M5442 Lumbago with sciatica, left side: Secondary | ICD-10-CM

## 2021-12-05 ENCOUNTER — Encounter: Payer: Self-pay | Admitting: Emergency Medicine

## 2021-12-05 ENCOUNTER — Ambulatory Visit
Admission: EM | Admit: 2021-12-05 | Discharge: 2021-12-05 | Disposition: A | Discharge status: Home / Self Care | Payer: No Typology Code available for payment source

## 2021-12-05 DIAGNOSIS — S30814A Abrasion of vagina and vulva, initial encounter: Secondary | ICD-10-CM | POA: Diagnosis not present

## 2021-12-05 DIAGNOSIS — L089 Local infection of the skin and subcutaneous tissue, unspecified: Secondary | ICD-10-CM | POA: Diagnosis not present

## 2021-12-05 DIAGNOSIS — N898 Other specified noninflammatory disorders of vagina: Secondary | ICD-10-CM | POA: Diagnosis not present

## 2021-12-05 HISTORY — DX: Pure hypercholesterolemia, unspecified: E78.00

## 2021-12-05 MED ORDER — CEPHALEXIN 500 MG PO CAPS: 500.0000 mg | ORAL_CAPSULE | Freq: Three times a day (TID) | ORAL | 0 refills | Status: DC

## 2021-12-05 MED ORDER — CLOTRIMAZOLE-BETAMETHASONE 1-0.05 % EX CREA: TOPICAL_CREAM | CUTANEOUS | 0 refills | Status: DC

## 2021-12-05 NOTE — ED Provider Notes (Signed)
?[facility] ? ? ?MRN: [id number] DOB: 03-Feb-1956 ? ?Subjective:  ? ?Belinda Arellano is a 66 y.o. female presenting for 2 to 3-day history of acute onset persistent vaginal itching, vaginal pain.  Patient states that she was scratching very aggressively due to the itching.  This ended up causing redness and swelling of the left side of her vagina.  Has not noted any particular discharge.  No fever, nausea, vomiting, vaginal bleeding.  Admits that the swelling actually has improved in the past day.  But she still has a lot of itching. ? ?No current facility-administered medications for this encounter. ? ?Current Outpatient Medications:  ?  acetaminophen (TYLENOL) 650 MG CR tablet, Take 650 mg by mouth every 8 (eight) hours as needed for pain., Disp: , Rfl:  ?  amLODipine (NORVASC) 5 MG tablet, Take 5 mg by mouth every evening., Disp: , Rfl:  ?  meloxicam (MOBIC) 15 MG tablet, Take 15 mg by mouth daily as needed for pain. , Disp: , Rfl:  ?  naproxen sodium (ALEVE) 220 MG tablet, Take 220 mg by mouth daily as needed., Disp: , Rfl:  ?  traMADol (ULTRAM) 50 MG tablet, Take 1-2 tablets (50-100 mg total) by mouth every 6 (six) hours as needed., Disp: 30 tablet, Rfl: 0  ? ?Allergies  ?Allergen Reactions  ? Codeine Itching  ? Latex Itching  ? ? ?Past Medical History:  ?Diagnosis Date  ? Anal fissure 10/2020  ? Arthritis   ? rt knee  ? Arthritis 06/16/2021  ? legs and shoulders  ? Congestion of nasal sinus   ? no fever  ? Hypertension   ? Wears glasses 06/16/2021  ? Wears partial dentures 06/16/2021  ?  ? ?Past Surgical History:  ?Procedure Laterality Date  ? ABDOMINAL HYSTERECTOMY    ? BREAST SURGERY Left   ? benign lumpectomy  ? COLONOSCOPY WITH PROPOFOL N/A 09/17/2016  ? Procedure: COLONOSCOPY WITH PROPOFOL;  Surgeon: Garlan Fair, MD;  Location: [facility];  Service: Endoscopy;  Laterality: N/A;  ? 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 Procedure: RIGHT ACHILLES  TENDON REPAIR, EXCISION PARTIAL CALCANEUS;  Surgeon: Ninetta Lights, MD;  Location: [place];  Service: Orthopedics;  Laterality: Right;  ANESTHESIA: GENERAL, BLOCK  ? EXCISION OF SKIN TAG N/A 06/22/2021  ? Procedure: EXCISION OF SKIN TAG;  Surgeon: Leighton Ruff, MD;  Location: [facility];  Service: General;  Laterality: N/A;  ? SPHINCTEROTOMY N/A 06/22/2021  ? Procedure: CHEMICAL SPHINCTEROTOMY;  Surgeon: Leighton Ruff, MD;  Location: [facility];  Service: General;  Laterality: N/A;  ? TUBAL LIGATION    ? ? ?No family history on file. ? ?Social History  ? ?Tobacco Use  ? Smoking status: Never  ? Smokeless tobacco: Never  ?Substance Use Topics  ? Alcohol use: No  ? Drug use: No  ? ? ?ROS ? ? ?Objective:  ? ?Vitals: ?BP (!) 140/94   Pulse 92   Temp 98.2 ?F (36.8 ?C) (Oral)   Resp 18   SpO2 95%  ? ?Physical Exam ?Constitutional:   ?   General: She is not in acute distress. ?   Appearance: Normal appearance. She is well-developed. She is not ill-appearing, toxic-appearing or diaphoretic.  ?HENT:  ?   Head: Normocephalic and atraumatic.  ?   Right Ear: External ear normal.  ?   Left Ear: External ear normal.  ?   Nose: Nose normal.  ?  Mouth/Throat:  ?   Mouth: Mucous membranes are moist.  ?Eyes:  ?   General: No scleral icterus.    ?   Right eye: No discharge.     ?   Left eye: No discharge.  ?   Extraocular Movements: Extraocular movements intact.  ?Cardiovascular:  ?   Rate and Rhythm: Normal rate.  ?Pulmonary:  ?   Effort: Pulmonary effort is normal.  ?Genitourinary: ? ? ?Skin: ?   General: Skin is warm and dry.  ?Neurological:  ?   General: No focal deficit present.  ?   Mental Status: She is alert and oriented to person, place, and time.  ?Psychiatric:     ?   Mood and Affect: Mood normal.     ?   Behavior: Behavior normal.     ?   Thought Content: Thought content normal.     ?   Judgment: Judgment normal.  ? ? ?Assessment and Plan :  ? ?PDMP not reviewed  this encounter. ? ?1. Infected abrasion   ?2. Vaginal itching   ? ? Suspect fungal infection, tinea cruris. Recommended topical treatment. Will also use Keflex for a secondary infected abrasion from her scratching aggressively. Counseled patient on potential for adverse effects with medications prescribed/recommended today, ER and return-to-clinic precautions discussed, patient verbalized understanding. ? ?  ?Jaynee Eagles, PA-C ?12/08/21 6767 ? ?

## 2021-12-05 NOTE — ED Triage Notes (Signed)
Vaginal itching on external area.  States she may have tore the skin scratching.  States left labia is swollen.  Denies discharge. ?

## 2021-12-12 ENCOUNTER — Other Ambulatory Visit: Payer: No Typology Code available for payment source

## 2021-12-23 ENCOUNTER — Ambulatory Visit
Admission: RE | Admit: 2021-12-23 | Discharge: 2021-12-23 | Disposition: A | Discharge status: Home / Self Care | Payer: No Typology Code available for payment source | Source: Ambulatory Visit | Attending: Geriatric Medicine | Admitting: Geriatric Medicine

## 2021-12-23 DIAGNOSIS — M5442 Lumbago with sciatica, left side: Secondary | ICD-10-CM

## 2022-01-18 ENCOUNTER — Other Ambulatory Visit (HOSPITAL_COMMUNITY)
Admission: RE | Admit: 2022-01-18 | Discharge: 2022-01-18 | Disposition: A | Discharge status: Home / Self Care | Payer: No Typology Code available for payment source | Source: Ambulatory Visit | Attending: Obstetrics & Gynecology | Admitting: Obstetrics & Gynecology

## 2022-01-18 ENCOUNTER — Telehealth: Payer: Self-pay

## 2022-01-18 ENCOUNTER — Encounter: Payer: Self-pay | Admitting: Obstetrics & Gynecology

## 2022-01-18 ENCOUNTER — Ambulatory Visit (INDEPENDENT_AMBULATORY_CARE_PROVIDER_SITE_OTHER): Payer: No Typology Code available for payment source | Admitting: Obstetrics & Gynecology

## 2022-01-18 VITALS — BP 116/78 | HR 78 | Resp 16 | Ht 61.75 in | Wt 251.0 lb

## 2022-01-18 DIAGNOSIS — E66813 Obesity, class 3: Secondary | ICD-10-CM

## 2022-01-18 DIAGNOSIS — Z1272 Encounter for screening for malignant neoplasm of vagina: Secondary | ICD-10-CM | POA: Diagnosis present

## 2022-01-18 DIAGNOSIS — Z90710 Acquired absence of both cervix and uterus: Secondary | ICD-10-CM

## 2022-01-18 DIAGNOSIS — Z90722 Acquired absence of ovaries, bilateral: Secondary | ICD-10-CM

## 2022-01-18 DIAGNOSIS — Z780 Asymptomatic menopausal state: Secondary | ICD-10-CM | POA: Diagnosis not present

## 2022-01-18 DIAGNOSIS — Z01419 Encounter for gynecological examination (general) (routine) without abnormal findings: Secondary | ICD-10-CM

## 2022-01-18 DIAGNOSIS — Z6841 Body Mass Index (BMI) 40.0 and over, adult: Secondary | ICD-10-CM

## 2022-01-18 DIAGNOSIS — Z9079 Acquired absence of other genital organ(s): Secondary | ICD-10-CM

## 2022-01-18 NOTE — Telephone Encounter (Signed)
-----   Message from Princess Bruins, MD sent at 01/18/2022 10:44 AM EDT ----- Regarding: Refer to [facility] Obesity BMI >46.

## 2022-01-18 NOTE — Progress Notes (Signed)
Belinda Arellano 15-Jun-1956 [id number]   History:    66 y.o. G2P2L2 Married  RP:  New patient presenting for annual gyn exam   HPI: S/P TAH/BSO at age 64.  Postmenopause, well on no HRT.  No pelvic pain.  No pain with occasional IC.  Pap Neg in 2018.  Pap reflex today.  Breasts Normal.  Mammo 06/2021 Neg at [facility].  Colono 2018, scheduled for Colono in 03/2022. BMI 46.28. Would like to loose weight.  Difficulty exercising because of joint/lower back issues.  Needs a lower calorie/carb diet. Would like a referral to Nutrition. Health labs with Fam MD.  BD normal at [facility] in 2023 per patient.  Past medical history,surgical history, family history and social history were all reviewed and documented in the EPIC chart.  Gynecologic History No LMP recorded. Patient has had a hysterectomy.  Obstetric History OB History  Gravida Para Term Preterm AB Living  '2 2 2     2  '$ SAB IAB Ectopic Multiple Live Births               # Outcome Date GA Lbr Len/2nd Weight Sex Delivery Anes PTL Lv  2 Term           1 Term              ROS: A ROS was performed and pertinent positives and negatives are included in the history. GENERAL: No fevers or chills. HEENT: No change in vision, no earache, sore throat or sinus congestion. NECK: No pain or stiffness. CARDIOVASCULAR: No chest pain or pressure. No palpitations. PULMONARY: No shortness of breath, cough or wheeze. GASTROINTESTINAL: No abdominal pain, nausea, vomiting or diarrhea, melena or bright red blood per rectum. GENITOURINARY: No urinary frequency, urgency, hesitancy or dysuria. MUSCULOSKELETAL: No joint or muscle pain, no back pain, no recent trauma. DERMATOLOGIC: No rash, no itching, no lesions. ENDOCRINE: No polyuria, polydipsia, no heat or cold intolerance. No recent change in weight. HEMATOLOGICAL: No anemia or easy bruising or bleeding. NEUROLOGIC: No headache, seizures, numbness, tingling or weakness. PSYCHIATRIC: No depression, no loss of interest in  normal activity or change in sleep pattern.     Exam:   BP 116/78   Pulse 78   Resp 16   Ht 5' 1.75" (1.568 m)   Wt 251 lb (113.9 kg)   BMI 46.28 kg/m   Body mass index is 46.28 kg/m.  General appearance : Well developed well nourished female. No acute distress HEENT: Eyes: no retinal hemorrhage or exudates,  Neck supple, trachea midline, no carotid bruits, no thyroidmegaly Lungs: Clear to auscultation, no rhonchi or wheezes, or rib retractions  Heart: Regular rate and rhythm, no murmurs or gallops Breast:Examined in sitting and supine position were symmetrical in appearance, no palpable masses or tenderness,  no skin retraction, no nipple inversion, no nipple discharge, no skin discoloration, no axillary or supraclavicular lymphadenopathy Abdomen: no palpable masses or tenderness, no rebound or guarding Extremities: no edema or skin discoloration or tenderness  Pelvic: Vulva: Normal             Vagina: No gross lesions or discharge  Cervix/Uterus Absent  Adnexa  Without masses or tenderness  Anus: Normal   Assessment/Plan:  66 y.o. female for annual exam   1. Encounter for Papanicolaou smear of vagina as part of routine gynecological examination S/P TAH/BSO at age 32. Postmenopause, well on no HRT.  No pelvic pain.  No pain with occasional IC.  Pap Neg in  2018.  Pap reflex today.  Breasts Normal.  Mammo 06/2021 Neg at [facility].  Colono 2018, scheduled for Colono in 03/2022. BMI 46.28. Would like to loose weight.  Difficulty exercising because of joint/lower back issues.  Needs a lower calorie/carb diet. Would like a referral to Nutrition. Health labs with Fam MD.  BD normal at [facility] in 2023 per patient. - Cytology - PAP( [place])  2. S/P TAH-BSO  3. Postmenopause S/P TAH/BSO at age 75. Postmenopause, well on no HRT.  No pelvic pain.  No pain with occasional IC.  4. Class 3 severe obesity due to excess calories with serious comorbidity and body mass index (BMI) of 45.0 to  49.9 in adult (HCC)  BMI 46.28. Would like to loose weight.  Difficulty exercising because of joint/lower back issues.  Needs a lower calorie/carb diet. Would like a referral to Nutrition. Request sent.  Princess Bruins MD, 9:58 AM 01/18/2022

## 2022-01-18 NOTE — Telephone Encounter (Signed)
Referral Sent

## 2022-01-25 LAB — CYTOLOGY - PAP
Comment: NEGATIVE
Diagnosis: UNDETERMINED — AB
High risk HPV: NEGATIVE

## 2022-02-06 NOTE — Telephone Encounter (Signed)
FYI. Per referral "Pt refused service." Will close encounter.

## 2022-02-21 ENCOUNTER — Ambulatory Visit (HOSPITAL_COMMUNITY): Payer: No Typology Code available for payment source | Attending: Neurosurgery | Admitting: Physical Therapy

## 2022-02-21 DIAGNOSIS — M6281 Muscle weakness (generalized): Secondary | ICD-10-CM | POA: Diagnosis present

## 2022-02-21 DIAGNOSIS — M5416 Radiculopathy, lumbar region: Secondary | ICD-10-CM | POA: Insufficient documentation

## 2022-02-21 NOTE — Therapy (Signed)
OUTPATIENT PHYSICAL THERAPY THORACOLUMBAR EVALUATION   Patient Name: Belinda Arellano MRN: [id number] DOB:11-09-1955, 66 y.o., female Today's Date: 02/21/2022   PT End of Session - 02/21/22 1615     Visit Number 1    Number of Visits 12    Date for PT Re-Evaluation 04/04/22    Authorization Type [place] preferred AET[employer] no visit limit medical review after 40    Progress Note Due on Visit 10    PT Start Time 1615    PT Stop Time 1700    PT Time Calculation (min) 45 min    Activity Tolerance Patient tolerated treatment well    Behavior During Therapy [facility] for tasks assessed/performed             Past Medical History:  Diagnosis Date   Anal fissure 10/2020   Arthritis    rt knee   Arthritis 06/16/2021   legs and shoulders   Congestion of nasal sinus    no fever   High cholesterol    Hypertension    Wears glasses 06/16/2021   Wears partial dentures 06/16/2021   Past Surgical History:  Procedure Laterality Date   ABDOMINAL HYSTERECTOMY     BREAST SURGERY Left    benign lumpectomy   COLONOSCOPY WITH PROPOFOL N/A 09/17/2016   Procedure: COLONOSCOPY WITH PROPOFOL;  Surgeon: Garlan Fair, MD;  [facility]: [facility];  Service: Endoscopy;  Laterality: N/A;   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 11/10/2015   Procedure: RIGHT ACHILLES TENDON REPAIR, EXCISION PARTIAL CALCANEUS;  Surgeon: Ninetta Lights, MD;  Location: [place];  Service: Orthopedics;  Laterality: Right;  ANESTHESIA: GENERAL, BLOCK   EXCISION OF SKIN TAG N/A 06/22/2021   Procedure: EXCISION OF SKIN TAG;  Surgeon: Leighton Ruff, MD;  Location: [place];  Service: General;  Laterality: N/A;   FOOT SURGERY Left    SPHINCTEROTOMY N/A 06/22/2021   Procedure: CHEMICAL SPHINCTEROTOMY;  Surgeon: Leighton Ruff, MD;  Location: [place];  Service: General;  Laterality: N/A;   TUBAL LIGATION     There are no problems to display for this  patient.   PCP: Lajean Manes  REFERRING PROVIDER:  Earnie Larsson, MD  REFERRING DIAG: PT eval/tx for [phone] lumbar stenosis w/neurogenic claudication   Rationale for Evaluation and Treatment Rehabilitation  THERAPY DIAG:  Radiculopathy, lumbar region  Muscle weakness (generalized)  ONSET DATE: Chronic   SUBJECTIVE:  SUBJECTIVE STATEMENT: Belinda Arellano noted about a week ago that the more she walked the more her thighs ached and felt weak.  She stopped walking due to it being so painful and she began gaining weight.  She tried to walk again but she was unable due to her back pain.  Now she has most of her pain in her right leg and lower back.  She started walking again and has eased into walking for 2 -15  minute increments.  She takes tylenol everyday. Pt states that if she stoops down she has to use her hands to get back up. She was walking 5 miles a day prior to her back problems  PERTINENT HISTORY:  Unremarkable   PAIN:  Are you having pain? Yes: Pain location: Rt leg and back ; 1/10; worst in the past week 6/10 Pain description: throbbing/aching  Aggravating factors: sitting and initial motion after sitting once she walks for about 10 minutes the pain starts easing . Relieving factors: walking    PRECAUTIONS: None  WEIGHT BEARING RESTRICTIONS No  FALLS:  Has patient fallen in last 6 months? No  LIVING ENVIRONMENT: Lives with: lives with their spouse Lives in: House/apartment Stairs: Yes: Internal: 2 steps; none at times she can go reciprocal other times she can not.  Has following equipment at home: None  OCCUPATION: full time on computer   PLOF: Independent  PATIENT GOALS less pain to be able to walk more, to feel stronger in her legs, to walk three miles a day.    OBJECTIVE:    DIAGNOSTIC FINDINGS:  IMPRESSION: 1. L3-L4 mild-to-moderate spinal canal stenosis and mild left neural foraminal narrowing. 2. Narrowing of the lateral recesses at L4-L5 could affect the descending L5 nerve roots. 3. Severe facet arthropathy at L5-S1, which can be a cause of back pain.  PATIENT SURVEYS:  FOTO 56  SCREENING FOR RED FLAGS: Bowel or bladder incontinence: No Spinal tumors: No Cauda equina syndrome: No Compression fracture: No Abdominal aneurysm: No  COGNITION:  Overall cognitive status: Within functional limits for tasks assessed     SENSATION: WFL   POSTURE: increased lumbar lordosis and decreased thoracic kyphosis  PALPATION: Not completed   LUMBAR ROM:   Active  A/PROM  eval  Flexion Fingers 6" from floor reps no change   Extension 20 degrees reps: increases radicular sx    Right lateral flexion   Left lateral flexion   Right rotation   Left rotation    (Blank rows = not tested)   LOWER EXTREMITY MMT:    MMT Right eval Left eval  Hip flexion 4+/5 5/5  Hip extension 2/5 2+/5  Hip abduction 4-/5 4/5  Hip adduction    Hip internal rotation    Hip external rotation    Knee flexion 5/5 4/5  Knee extension 5/5 5/5  Ankle dorsiflexion 4/5 4/5  Ankle plantarflexion    Ankle inversion    Ankle eversion     (Blank rows = not tested)    FUNCTIONAL TESTS:  30 seconds chair stand test 11- below average for age  49 minute walk test: no assistive device 487 ft  Single leg stance : Rt: 60"; LT:     TODAY'S TREATMENT  Knee to chest 2 x 30"  Abdominal set x 5 Bridge x 5    PATIENT EDUCATION:  Education details: Mckenzie lumbar support and HEp Person educated: Patient Education method: Explanation Education comprehension: verbalized understanding and returned demonstration   HOME EXERCISE PROGRAM: [id number]:  ab set, bridge, knee to chest   ASSESSMENT:  CLINICAL IMPRESSION: Patient is a 66 y.o. female who was seen today for  physical therapy evaluation and treatment for B lumbar radiculopathy. Evaluation demonstrates decreased strength, decreased ROM, decreased activity tolerance and increased pain.  Belinda Arellano will benefit from skilled PT to address these issues and maximize her functional ability.  Evaluation demonstrated a biased for flexion based stretches.   OBJECTIVE IMPAIRMENTS decreased activity tolerance, difficulty walking, decreased ROM, decreased strength, and pain.   ACTIVITY LIMITATIONS lifting, bending, sitting, squatting, stairs, and locomotion level  PARTICIPATION LIMITATIONS: cleaning, shopping, community activity, occupation, and yard work  PERSONAL FACTORS Time since onset of injury/illness/exacerbation are also affecting patient's functional outcome.   REHAB POTENTIAL: Good  CLINICAL DECISION MAKING: Stable/uncomplicated  EVALUATION COMPLEXITY: Low   GOALS: Goals reviewed with patient? No  SHORT TERM GOALS: Target date: 03/14/2022  Pt to be I in Hep to decrease pain to no greater than a 4/10 Baseline: Goal status: INITIAL  2.  Pt to be educated on the importance of limiting prolong sitting, proper body mechanics for bed mobility and lifting  Baseline:  Goal status: INITIAL  3.  Pt to no longer have radicular sx to demonstrate decreased nerve irritation  Baseline:  Goal status: INITIAL  LONG TERM GOALS: Target date: 04/04/2022  PT to be completing an advanced HEP to decrease pain to no greater than a 2/10 Baseline:  Goal status: INITIAL  2.  Pt LE strength to be at least 4+/5 to allow pt to rise from a squatted postition Baseline:  Goal status: INITIAL  3.  Pt to be back walking at least 3 miles a day  Baseline:  Goal status: INITIAL     PLAN: PT FREQUENCY: 2x/week  PT DURATION: 6 weeks  PLANNED INTERVENTIONS: Therapeutic exercises, Therapeutic activity, Neuromuscular re-education, Balance training, Gait training, Patient/Family education, and Manual  therapy.  PLAN FOR NEXT SESSION: Continue with lumbar stabilization and flexion based stretches.   Rayetta Humphrey, PT CLT [phone]  02/21/2022, 5:15 PM

## 2022-02-23 ENCOUNTER — Ambulatory Visit (HOSPITAL_COMMUNITY): Payer: No Typology Code available for payment source | Admitting: Physical Therapy

## 2022-02-23 DIAGNOSIS — M5416 Radiculopathy, lumbar region: Secondary | ICD-10-CM | POA: Diagnosis not present

## 2022-02-23 DIAGNOSIS — M6281 Muscle weakness (generalized): Secondary | ICD-10-CM

## 2022-02-23 NOTE — Therapy (Signed)
OUTPATIENT PHYSICAL THERAPY THORACOLUMBAR EVALUATION   Patient Name: Belinda Arellano MRN: [id number] DOB:1956/07/25, 66 y.o., female Today's Date: 02/23/2022   PT End of Session - 02/23/22 1131     Visit Number 2    Number of Visits 12    Date for PT Re-Evaluation 04/04/22    Authorization Type [place] preferred AETNA no visit limit medical review after 40    Progress Note Due on Visit 10    PT Start Time 1133    PT Stop Time 1212    PT Time Calculation (min) 39 min             Past Medical History:  Diagnosis Date   Anal fissure 10/2020   Arthritis    rt knee   Arthritis 06/16/2021   legs and shoulders   Congestion of nasal sinus    no fever   High cholesterol    Hypertension    Wears glasses 06/16/2021   Wears partial dentures 06/16/2021   Past Surgical History:  Procedure Laterality Date   ABDOMINAL HYSTERECTOMY     BREAST SURGERY Left    benign lumpectomy   COLONOSCOPY WITH PROPOFOL N/A 09/17/2016   Procedure: COLONOSCOPY WITH PROPOFOL;  Surgeon: Garlan Fair, MD;  Location: [facility];  Service: Endoscopy;  Laterality: N/A;   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 11/10/2015   Procedure: RIGHT ACHILLES TENDON REPAIR, EXCISION PARTIAL CALCANEUS;  Surgeon: Ninetta Lights, MD;  Location: [place];  Service: Orthopedics;  Laterality: Right;  ANESTHESIA: GENERAL, BLOCK   EXCISION OF SKIN TAG N/A 06/22/2021   Procedure: EXCISION OF SKIN TAG;  Surgeon: Leighton Ruff, MD;  Location: [place];  Service: General;  Laterality: N/A;   FOOT SURGERY Left    SPHINCTEROTOMY N/A 06/22/2021   Procedure: CHEMICAL SPHINCTEROTOMY;  Surgeon: Leighton Ruff, MD;  Location: [place];  Service: General;  Laterality: N/A;   TUBAL LIGATION     There are no problems to display for this patient.   PCP: Lajean Manes  REFERRING PROVIDER:  Earnie Larsson, MD  REFERRING DIAG: PT eval/tx for [phone]  lumbar stenosis w/neurogenic claudication   Rationale for Evaluation and Treatment Rehabilitation  THERAPY DIAG:  Radiculopathy, lumbar region  Muscle weakness (generalized)  ONSET DATE: Chronic   SUBJECTIVE:                                                                                                                                                                                           SUBJECTIVE STATEMENT:  Pt states she is still stiff and sore.    PAIN:  Are you having pain? Yes: Pain location: Rt leg and back ; 5/10; worst in the past week 6/10 Pain description: throbbing/aching  Aggravating factors: sitting and initial motion after sitting once she walks for about 10 minutes the pain starts easing . Relieving factors: walking    PATIENT GOALS less pain to be able to walk more, to feel stronger in her legs, to walk three miles a day.    OBJECTIVE:   DIAGNOSTIC FINDINGS:  IMPRESSION: 1. L3-L4 mild-to-moderate spinal canal stenosis and mild left neural foraminal narrowing. 2. Narrowing of the lateral recesses at L4-L5 could affect the descending L5 nerve roots. 3. Severe facet arthropathy at L5-S1, which can be a cause of back pain.  PATIENT SURVEYS:  FOTO 56   POSTURE: increased lumbar lordosis and decreased thoracic kyphosis  PALPATION: Not completed   LUMBAR ROM:   Active  A/PROM  eval  Flexion Fingers 6" from floor reps no change   Extension 20 degrees reps: increases radicular sx    Right lateral flexion   Left lateral flexion   Right rotation   Left rotation    (Blank rows = not tested)   LOWER EXTREMITY MMT:    MMT Right eval Left eval  Hip flexion 4+/5 5/5  Hip extension 2/5 2+/5  Hip abduction 4-/5 4/5  Hip adduction    Hip internal rotation    Hip external rotation    Knee flexion 5/5 4/5  Knee extension 5/5 5/5  Ankle dorsiflexion 4/5 4/5  Ankle plantarflexion    Ankle inversion    Ankle eversion     (Blank rows = not  tested)    FUNCTIONAL TESTS:  30 seconds chair stand test 11- below average for age  82 minute walk test: no assistive device 487 ft  Single leg stance : Rt: 60"; LT:     TODAY'S TREATMENT  02/23/22: Standing:  heel raises x 10                  Functional squat x 10 Supine:     Ab set x 10                   Pelvic tilt x 10                  Bridge x 10                   Clam x 10                  Bent knee raise x 10                  Knee to chest x 3 x 30 Side lying: Abduction x 10 B  Sitting :    Tall sitting x 5     Evaluation: 02/22/22 Knee to chest 2 x 30"  Abdominal set x 5 Bridge x 5    PATIENT EDUCATION:  Education details: Sports administrator support and HEp Person educated: Patient Education method: Explanation Education comprehension: verbalized understanding and returned demonstration   HOME EXERCISE PROGRAM: [id number]: ab set, bridge, knee to chest   ASSESSMENT:  CLINICAL IMPRESSION:  Reviewed evaluation and goals with  patient.  Added functional strengthening exercises in standing as well as progressed lumbar stabilization.  Pt progressed in lumbar stabilization program.  Pt will continue to benefit from skilled PT to address functional impairments.  OBJECTIVE IMPAIRMENTS decreased activity tolerance, difficulty walking, decreased ROM, decreased  strength, and pain.   ACTIVITY LIMITATIONS lifting, bending, sitting, squatting, stairs, and locomotion level  PARTICIPATION LIMITATIONS: cleaning, shopping, community activity, occupation, and yard work  PERSONAL FACTORS Time since onset of injury/illness/exacerbation are also affecting patient's functional outcome.   REHAB POTENTIAL: Good  CLINICAL DECISION MAKING: Stable/uncomplicated  EVALUATION COMPLEXITY: Low   GOALS: Goals reviewed with patient? No  SHORT TERM GOALS: Target date: 03/16/2022  Pt to be I in Hep to decrease pain to no greater than a 4/10 Baseline: Goal status: IN PROGRESS  2.  Pt to  be educated on the importance of limiting prolong sitting, proper body mechanics for bed mobility and lifting  Baseline:  Goal status: IN PROGRESS  3.  Pt to no longer have radicular sx to demonstrate decreased nerve irritation  Baseline:  Goal status: IN PROGRESS  LONG TERM GOALS: Target date: 04/06/2022  PT to be completing an advanced HEP to decrease pain to no greater than a 2/10 Baseline:  Goal status: IN PROGRESS  2.  Pt LE strength to be at least 4+/5 to allow pt to rise from a squatted position without using her hands  Baseline:  Goal status: IN PROGRESS  3.  Pt to be back walking at least 3 miles a day  Baseline:  Goal status: IN PROGRESS     PLAN: PT FREQUENCY: 2x/week  PT DURATION: 6 weeks  PLANNED INTERVENTIONS: Therapeutic exercises, Therapeutic activity, Neuromuscular re-education, Balance training, Gait training, Patient/Family education, and Manual therapy.  PLAN FOR NEXT SESSION: Continue with lumbar stabilization and flexion based stretches.   Rayetta Humphrey, PT CLT [phone]  02/23/2022, 12:15 PM

## 2022-02-26 ENCOUNTER — Encounter (HOSPITAL_COMMUNITY): Payer: Self-pay

## 2022-02-26 ENCOUNTER — Ambulatory Visit (HOSPITAL_COMMUNITY): Payer: No Typology Code available for payment source | Attending: Neurosurgery

## 2022-02-26 DIAGNOSIS — M6281 Muscle weakness (generalized): Secondary | ICD-10-CM | POA: Diagnosis present

## 2022-02-26 DIAGNOSIS — M5416 Radiculopathy, lumbar region: Secondary | ICD-10-CM | POA: Insufficient documentation

## 2022-02-26 NOTE — Therapy (Addendum)
OUTPATIENT PHYSICAL THERAPY THORACOLUMBAR EVALUATION   Patient Name: Belinda Arellano MRN: [id number] DOB:1956-08-03, 66 y.o., female Today's Date: 02/26/2022   PT End of Session - 02/26/22 1303     Visit Number 3    Number of Visits 12    Date for PT Re-Evaluation 04/04/22    Authorization Type [place] preferred AETNA no visit limit medical review after 40    Progress Note Due on Visit 10    PT Start Time 1304    PT Stop Time 1342    PT Time Calculation (min) 38 min    Activity Tolerance Patient tolerated treatment well    Behavior During Therapy [facility] for tasks assessed/performed             Past Medical History:  Diagnosis Date   Anal fissure 10/2020   Arthritis    rt knee   Arthritis 06/16/2021   legs and shoulders   Congestion of nasal sinus    no fever   High cholesterol    Hypertension    Wears glasses 06/16/2021   Wears partial dentures 06/16/2021   Past Surgical History:  Procedure Laterality Date   ABDOMINAL HYSTERECTOMY     BREAST SURGERY Left    benign lumpectomy   COLONOSCOPY WITH PROPOFOL N/A 09/17/2016   Procedure: COLONOSCOPY WITH PROPOFOL;  Surgeon: Garlan Fair, MD;  Location: [facility];  Service: Endoscopy;  Laterality: N/A;   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 11/10/2015   Procedure: RIGHT ACHILLES TENDON REPAIR, EXCISION PARTIAL CALCANEUS;  Surgeon: Ninetta Lights, MD;  Location: [place];  Service: Orthopedics;  Laterality: Right;  ANESTHESIA: GENERAL, BLOCK   EXCISION OF SKIN TAG N/A 06/22/2021   Procedure: EXCISION OF SKIN TAG;  Surgeon: Leighton Ruff, MD;  Location: [place];  Service: General;  Laterality: N/A;   FOOT SURGERY Left    SPHINCTEROTOMY N/A 06/22/2021   Procedure: CHEMICAL SPHINCTEROTOMY;  Surgeon: Leighton Ruff, MD;  Location: [place];  Service: General;  Laterality: N/A;   TUBAL LIGATION     There are no problems to display for this  patient.   PCP: Lajean Manes  REFERRING PROVIDER:  Earnie Larsson, MD  REFERRING DIAG: PT eval/tx for [phone] lumbar stenosis w/neurogenic claudication   Rationale for Evaluation and Treatment Rehabilitation  THERAPY DIAG:  Radiculopathy, lumbar region  Muscle weakness (generalized)  ONSET DATE: Chronic   SUBJECTIVE:  SUBJECTIVE STATEMENT:  Pt states she is still having the highest pain in the morning and they trying her HEP then to relief.  Walking also starts painful especially first lap and then subsides as continues "just rough getting started".  Feeling "please with the few days" of therapy already feeling it is helpful. Stated "I feel better walking out now".   PAIN:  Are you having pain? Yes: Pain location: Rt leg and back ; 5/10; worst in the past week 7/10 Pain description: throbbing/aching  Aggravating factors: cleaned house on Saturday; sitting and initial motion after sitting once she walks for about 10 minutes the pain starts easing . Relieving factors: walking    PATIENT GOALS less pain to be able to walk more, to feel stronger in her legs, to walk three miles a day.    OBJECTIVE:   DIAGNOSTIC FINDINGS:  IMPRESSION: 1. L3-L4 mild-to-moderate spinal canal stenosis and mild left neural foraminal narrowing. 2. Narrowing of the lateral recesses at L4-L5 could affect the descending L5 nerve roots. 3. Severe facet arthropathy at L5-S1, which can be a cause of back pain.  PATIENT SURVEYS:  FOTO 56   POSTURE: increased lumbar lordosis and decreased thoracic kyphosis  PALPATION: Not completed   LUMBAR ROM:   Active  A/PROM  eval  Flexion Fingers 6" from floor reps no change   Extension 20 degrees reps: increases radicular sx    Right lateral flexion   Left lateral flexion    Right rotation   Left rotation    (Blank rows = not tested)   LOWER EXTREMITY MMT:    MMT Right eval Left eval  Hip flexion 4+/5 5/5  Hip extension 2/5 2+/5  Hip abduction 4-/5 4/5  Hip adduction    Hip internal rotation    Hip external rotation    Knee flexion 5/5 4/5  Knee extension 5/5 5/5  Ankle dorsiflexion 4/5 4/5  Ankle plantarflexion    Ankle inversion    Ankle eversion     (Blank rows = not tested)    FUNCTIONAL TESTS:  30 seconds chair stand test 11- below average for age  42 minute walk test: no assistive device 487 ft  Single leg stance : Rt: 60"; LT:     TODAY'S TREATMENT  02/26/22:  Supine: Ab set x 10 with 20 sec hold with cue for breathing   Pelvic tilt x 20 with cue for full ROM   Bridge x 10 x 2 with cue for core activation and glut squeeze before lift    - Between sets Single knee to chest x 30 sec x 2 B    Bent knee raise = Supine marching with cue for abdominal brace 2 x 10 reps   Straight leg raise with sciatic nerve glide ankle PF/DF modulation x 30 sec x 2 B    Lower trunk rotations x 10 reps B slow control cued  Sitting: Big ball roll out lumbar flexion x 10 reps   Sit to stand x 10 reps   Big ball roll out lumbar flexion round 2 x 10 reps        02/23/22: Standing:  heel raises x 10                  Functional squat x 10 Supine:     Ab set x 10                   Pelvic tilt x 10  Bridge x 10                   Clam x 10                  Bent knee raise x 10                  Knee to chest x 3 x 30 Side lying: Abduction x 10 B  Sitting :    Tall sitting x 5     Evaluation: 02/22/22 Knee to chest 2 x 30"  Abdominal set x 5 Bridge x 5    PATIENT EDUCATION:  Education details: Sports administrator support and HEp Person educated: Patient Education method: Explanation Education comprehension: verbalized understanding and returned demonstration   HOME EXERCISE PROGRAM: [id number]: ab set, bridge, knee to chest    ASSESSMENT:  CLINICAL IMPRESSION:   Today's session continued to build lumbar stabilization along with additional sciatic nerve opening techniques.  Patient tolerated all well and did best when bridging activity was cued for core to avoid extension and then was supported post with flexion and rotation opening. Patient needed consistent cueing for abdominal bracing to support proper movement.  Terease also acceptable to good challenges as she is showing good motivation for improvement as well. Pt will continue to benefit from skilled PT to address functional impairments.   OBJECTIVE IMPAIRMENTS decreased activity tolerance, difficulty walking, decreased ROM, decreased strength, and pain.   ACTIVITY LIMITATIONS lifting, bending, sitting, squatting, stairs, and locomotion level  PARTICIPATION LIMITATIONS: cleaning, shopping, community activity, occupation, and yard work  PERSONAL FACTORS Time since onset of injury/illness/exacerbation are also affecting patient's functional outcome.   REHAB POTENTIAL: Good  CLINICAL DECISION MAKING: Stable/uncomplicated  EVALUATION COMPLEXITY: Low   GOALS: Goals reviewed with patient? No  SHORT TERM GOALS: Target date: 03/19/2022  Pt to be I in Hep to decrease pain to no greater than a 4/10 Baseline: Goal status: IN PROGRESS  2.  Pt to be educated on the importance of limiting prolong sitting, proper body mechanics for bed mobility and lifting  Baseline:  Goal status: IN PROGRESS  3.  Pt to no longer have radicular sx to demonstrate decreased nerve irritation  Baseline:  Goal status: IN PROGRESS  LONG TERM GOALS: Target date: 04/09/2022  PT to be completing an advanced HEP to decrease pain to no greater than a 2/10 Baseline:  Goal status: IN PROGRESS  2.  Pt LE strength to be at least 4+/5 to allow pt to rise from a squatted position without using her hands  Baseline:  Goal status: IN PROGRESS  3.  Pt to be back walking at least 3 miles a  day  Baseline:  Goal status: IN PROGRESS     PLAN: PT FREQUENCY: 2x/week  PT DURATION: 6 weeks  PLANNED INTERVENTIONS: Therapeutic exercises, Therapeutic activity, Neuromuscular re-education, Balance training, Gait training, Patient/Family education, and Manual therapy.  PLAN FOR NEXT SESSION: Continue with lumbar stabilization and flexion based stretches, advance functional upright strengthening as well.     1:37 PM, 02/26/22  Margarette Asal. Carlis Abbott PT, DPT  Contract Physical Therapist at  [facility] [phone]

## 2022-03-09 ENCOUNTER — Ambulatory Visit (HOSPITAL_COMMUNITY): Payer: No Typology Code available for payment source

## 2022-03-09 ENCOUNTER — Encounter (HOSPITAL_COMMUNITY): Payer: Self-pay

## 2022-03-09 DIAGNOSIS — M6281 Muscle weakness (generalized): Secondary | ICD-10-CM

## 2022-03-09 DIAGNOSIS — M5416 Radiculopathy, lumbar region: Secondary | ICD-10-CM

## 2022-03-09 NOTE — Therapy (Signed)
OUTPATIENT PHYSICAL THERAPY THORACOLUMBAR EVALUATION   Patient Name: Belinda Arellano MRN: [id number] DOB:19-Dec-1955, 66 y.o., female Today's Date: 03/09/2022   PT End of Session - 03/09/22 1123     Visit Number 4    Number of Visits 12    Date for PT Re-Evaluation 04/04/22    Authorization Type [place] preferred AETNA no visit limit medical review after 40    Progress Note Due on Visit 10    PT Start Time 1123    PT Stop Time 1201    PT Time Calculation (min) 38 min    Activity Tolerance Patient tolerated treatment well    Behavior During Therapy [facility] for tasks assessed/performed             Past Medical History:  Diagnosis Date   Anal fissure 10/2020   Arthritis    rt knee   Arthritis 06/16/2021   legs and shoulders   Congestion of nasal sinus    no fever   High cholesterol    Hypertension    Wears glasses 06/16/2021   Wears partial dentures 06/16/2021   Past Surgical History:  Procedure Laterality Date   ABDOMINAL HYSTERECTOMY     BREAST SURGERY Left    benign lumpectomy   COLONOSCOPY WITH PROPOFOL N/A 09/17/2016   Procedure: COLONOSCOPY WITH PROPOFOL;  Surgeon: Garlan Fair, MD;  Location: [facility];  Service: Endoscopy;  Laterality: N/A;   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;  Service: General;  Laterality: N/A;   FOOT SURGERY Left    SPHINCTEROTOMY N/A 06/22/2021   Procedure: CHEMICAL SPHINCTEROTOMY;  Surgeon: Leighton Ruff, MD;  Location: [place];  Service: General;  Laterality: N/A;   TUBAL LIGATION     There are no problems to display for this  patient.   PCP: Lajean Manes  REFERRING PROVIDER:  Earnie Larsson, MD  REFERRING DIAG: PT eval/tx for [phone] lumbar stenosis w/neurogenic claudication   Rationale for Evaluation and Treatment Rehabilitation  THERAPY DIAG:  Radiculopathy, lumbar region  Muscle weakness (generalized)  ONSET DATE: Chronic   SUBJECTIVE:  SUBJECTIVE STATEMENT:  Pt states that she is in 2/10 pain in both legs. Otherwise pt is doing ok and ready for PT.  PAIN:  Are you having pain? Yes: Pain location: Rt leg and back ; 2/10; worst in the past week 7/10 Pain description: throbbing/aching  Aggravating factors: cleaned house on Saturday; sitting and initial motion after sitting once she walks for about 10 minutes the pain starts easing . Relieving factors: walking    PATIENT GOALS less pain to be able to walk more, to feel stronger in her legs, to walk three miles a day.    OBJECTIVE:   DIAGNOSTIC FINDINGS:  IMPRESSION: 1. L3-L4 mild-to-moderate spinal canal stenosis and mild left neural foraminal narrowing. 2. Narrowing of the lateral recesses at L4-L5 could affect the descending L5 nerve roots. 3. Severe facet arthropathy at L5-S1, which can be a cause of back pain.  PATIENT SURVEYS:  FOTO 56   POSTURE: increased lumbar lordosis and decreased thoracic kyphosis  PALPATION: Not completed   LUMBAR ROM:   Active  A/PROM  eval  Flexion Fingers 6" from floor reps no change   Extension 20 degrees reps: increases radicular sx    Right lateral flexion   Left lateral flexion   Right rotation   Left rotation    (Blank rows = not tested)   LOWER EXTREMITY MMT:    MMT Right eval Left eval  Hip flexion 4+/5 5/5  Hip extension 2/5 2+/5  Hip abduction 4-/5 4/5  Hip adduction    Hip internal rotation     Hip external rotation    Knee flexion 5/5 4/5  Knee extension 5/5 5/5  Ankle dorsiflexion 4/5 4/5  Ankle plantarflexion    Ankle inversion    Ankle eversion     (Blank rows = not tested)    FUNCTIONAL TESTS:  30 seconds chair stand test 11- below average for age  19 minute walk test: no assistive device 487 ft  Single leg stance : Rt: 60"; LT:     TODAY'S TREATMENT  03/09/22 Seated piriformis stretch 10 sec holds x4  b/l Seated hamstring stretch with foot prop on stool 10 sec x4 b/l  Seated Physioball forward rolls 5 sec holds x15 reps Stair stretch for hamstring, groin and back, forward and then with rotation 10 sec holds x2 sets  b/l PPT with manual cueing, 3 breaths x8 rounds Hooklying LTR 3 sec holds  Prone hip extensions 2x5 Lying prone x3 mins Prone press ups x20   02/26/22:  Supine: Ab set x 10 with 20 sec hold with cue for breathing   Pelvic tilt x 20 with cue for full ROM   Bridge x 10 x 2 with cue for core activation and glut squeeze before lift    - Between sets Single knee to chest x 30 sec x 2 B    Bent knee raise = Supine marching with cue for abdominal brace 2 x 10 reps   Straight leg raise with sciatic nerve glide ankle PF/DF modulation x 30 sec x 2 B    Lower trunk rotations x 10 reps B slow control cued  Sitting: Big ball roll out lumbar flexion x 10 reps   Sit to stand x 10 reps   Big ball roll out lumbar flexion round 2 x 10 reps        02/23/22: Standing:  heel raises x 10  Functional squat x 10 Supine:     Ab set x 10                   Pelvic tilt x 10                  Bridge x 10                   Clam x 10                  Bent knee raise x 10                  Knee to chest x 3 x 30 Side lying: Abduction x 10 B  Sitting :    Tall sitting x 5     Evaluation: 02/22/22 Knee to chest 2 x 30"  Abdominal set x 5 Bridge x 5    PATIENT EDUCATION:  Education details: Sports administrator support and HEp Person educated:  Patient Education method: Explanation Education comprehension: verbalized understanding and returned demonstration   HOME EXERCISE PROGRAM:  Access Code: [facility] URL: [email or web address] Date: 03/09/2022 Prepared by: Leota Jacobsen  Exercises - Seated Piriformis Stretch with Trunk Bend  - 1 x daily - 7 x weekly - 1 sets - 5 reps - 10 sec hold - Seated Hamstring Stretch with Chair  - 1 x daily - 7 x weekly - 1 sets - 5 reps - 10 sec hold - Seated Flexion Stretch with Swiss Ball  - 1 x daily - 7 x weekly - 2 sets - 10 reps - 5 sec hold - Supine Posterior Pelvic Tilt  - 1 x daily - 7 x weekly - 1 sets - 10 reps - 3 breathss hold - Supine Lower Trunk Rotation  - 1 x daily - 7 x weekly - 3 sets - 10 reps - 3sec hold - Prone Hip Extension  - 2 x daily - 7 x weekly - 1 sets - 10 reps - Lying Prone  - 2 x daily - 7 x weekly - 1 sets - 3 mins hold - Prone Press Up  - 2 x daily - 7 x weekly - 1 sets - 15 reps  [id number]: ab set, bridge, knee to chest   ASSESSMENT:  CLINICAL IMPRESSION:    Pt tolerates session well. Pt reports decrease in radicular symptoms with prone lying though pt does report that usually laying on stomach hurts her back. Instructed pt to try prone exercises at home and report back on how she feels. Pt given updated HEP, pt without questions at this time. Pt will continue to benefit from skilled PT to address functional impairments.   OBJECTIVE IMPAIRMENTS decreased activity tolerance, difficulty walking, decreased ROM, decreased strength, and pain.   ACTIVITY LIMITATIONS lifting, bending, sitting, squatting, stairs, and locomotion level  PARTICIPATION LIMITATIONS: cleaning, shopping, community activity, occupation, and yard work  PERSONAL FACTORS Time since onset of injury/illness/exacerbation are also affecting patient's functional outcome.   REHAB POTENTIAL: Good  CLINICAL DECISION MAKING: Stable/uncomplicated  EVALUATION COMPLEXITY:  Low   GOALS: Goals reviewed with patient? No  SHORT TERM GOALS: Target date: 03/30/2022  Pt to be I in Hep to decrease pain to no greater than a 4/10 Baseline: Goal status: IN PROGRESS  2.  Pt to be educated on the importance of limiting prolong sitting, proper body mechanics for bed mobility and lifting  Baseline:  Goal status: IN PROGRESS  3.  Pt  to no longer have radicular sx to demonstrate decreased nerve irritation  Baseline:  Goal status: IN PROGRESS  LONG TERM GOALS: Target date: 04/20/2022  PT to be completing an advanced HEP to decrease pain to no greater than a 2/10 Baseline:  Goal status: IN PROGRESS  2.  Pt LE strength to be at least 4+/5 to allow pt to rise from a squatted position without using her hands  Baseline:  Goal status: IN PROGRESS  3.  Pt to be back walking at least 3 miles a day  Baseline:  Goal status: IN PROGRESS     PLAN: PT FREQUENCY: 2x/week  PT DURATION: 6 weeks  PLANNED INTERVENTIONS: Therapeutic exercises, Therapeutic activity, Neuromuscular re-education, Balance training, Gait training, Patient/Family education, and Manual therapy.  PLAN FOR NEXT SESSION: Continue with lumbar stabilization and flexion based stretches, advance functional upright strengthening as well.     12:12 PM, 03/09/22  Kaisen Ackers PT, DPT

## 2022-03-21 ENCOUNTER — Encounter (HOSPITAL_COMMUNITY): Payer: No Typology Code available for payment source

## 2022-03-26 ENCOUNTER — Encounter (HOSPITAL_COMMUNITY): Payer: No Typology Code available for payment source | Admitting: Physical Therapy

## 2022-03-29 ENCOUNTER — Ambulatory Visit (HOSPITAL_COMMUNITY): Payer: No Typology Code available for payment source | Attending: Neurosurgery | Admitting: Physical Therapy

## 2022-03-29 DIAGNOSIS — M6281 Muscle weakness (generalized): Secondary | ICD-10-CM | POA: Diagnosis present

## 2022-03-29 DIAGNOSIS — M5416 Radiculopathy, lumbar region: Secondary | ICD-10-CM | POA: Insufficient documentation

## 2022-03-29 NOTE — Therapy (Addendum)
OUTPATIENT PHYSICAL THERAPY THORACOLUMBAR EVALUATION   Patient Name: Belinda Arellano MRN: [id number] DOB:12-Jan-1956, 66 y.o., female Today's Date: 03/29/2022   PT End of Session - 03/29/22 900     Visit Number 5    Number of Visits 12    Date for PT Re-Evaluation 04/04/22    Authorization Type [place] preferred AETNA no visit limit medical review after 40    Progress Note Due on Visit 10    PT Start Time 0815    PT Stop Time 0858    PT Time Calculation (min) 43 min    Activity Tolerance Patient tolerated treatment well    Behavior During Therapy [facility] for tasks assessed/performed              Past Medical History:  Diagnosis Date   Anal fissure 10/2020   Arthritis    rt knee   Arthritis 06/16/2021   legs and shoulders   Congestion of nasal sinus    no fever   High cholesterol    Hypertension    Wears glasses 06/16/2021   Wears partial dentures 06/16/2021   Past Surgical History:  Procedure Laterality Date   ABDOMINAL HYSTERECTOMY     BREAST SURGERY Left    benign lumpectomy   COLONOSCOPY WITH PROPOFOL N/A 09/17/2016   Procedure: COLONOSCOPY WITH PROPOFOL;  Surgeon: Garlan Fair, MD;  Location: [facility];  Service: Endoscopy;  Laterality: N/A;   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 11/10/2015   Procedure: RIGHT ACHILLES TENDON REPAIR, EXCISION PARTIAL CALCANEUS;  Surgeon: Ninetta Lights, MD;  Location: [place];  Service: Orthopedics;  Laterality: Right;  ANESTHESIA: GENERAL, BLOCK   EXCISION OF SKIN TAG N/A 06/22/2021   Procedure: EXCISION OF SKIN TAG;  Surgeon: Leighton Ruff, MD;  Location: [place];  Service: General;  Laterality: N/A;   FOOT SURGERY Left    SPHINCTEROTOMY N/A 06/22/2021   Procedure: CHEMICAL SPHINCTEROTOMY;  Surgeon: Leighton Ruff, MD;  Location: [place];  Service: General;  Laterality: N/A;   TUBAL LIGATION     There are no problems to display for this  patient.   PCP: Lajean Manes  REFERRING PROVIDER:  Earnie Larsson, MD  REFERRING DIAG: PT eval/tx for [phone] lumbar stenosis w/neurogenic claudication   Rationale for Evaluation and Treatment Rehabilitation  THERAPY DIAG:  Radiculopathy, lumbar region  Muscle weakness (generalized)  ONSET DATE: Chronic   SUBJECTIVE:  SUBJECTIVE STATEMENT:  Pt has been on vacation.  Pt states that she has been released from her MD.  She is still periodically getting pain in the front of her legs about every other day.  She feels that she is about 98% better.  PAIN:  Are you having pain? Yes: Pain location: Rt leg and back ; 1/10; worst in the past week 7/10 Pain description: throbbing/aching  Aggravating factors: cleaned house on Saturday; sitting and initial motion after sitting once she walks for about 10 minutes the pain starts easing . Relieving factors: walking    PATIENT GOALS less pain to be able to walk more, to feel stronger in her legs, to walk three miles a day.    OBJECTIVE:   DIAGNOSTIC FINDINGS:  IMPRESSION: 1. L3-L4 mild-to-moderate spinal canal stenosis and mild left neural foraminal narrowing. 2. Narrowing of the lateral recesses at L4-L5 could affect the descending L5 nerve roots. 3. Severe facet arthropathy at L5-S1, which can be a cause of back pain.  PATIENT SURVEYS:  FOTO 56   POSTURE: increased lumbar lordosis and decreased thoracic kyphosis  PALPATION: Not completed   LUMBAR ROM:   Active  A/PROM  eval  Flexion Fingers 6" from floor reps no change   Extension 20 degrees reps: increases radicular sx    Right lateral flexion   Left lateral flexion   Right rotation   Left rotation    (Blank rows = not tested)   LOWER EXTREMITY MMT:    MMT Right eval Left eval   Hip flexion 4+/5 5/5  Hip extension 2/5 2+/5  Hip abduction 4-/5 4/5  Hip adduction    Hip internal rotation    Hip external rotation    Knee flexion 5/5 4/5  Knee extension 5/5 5/5  Ankle dorsiflexion 4/5 4/5  Ankle plantarflexion    Ankle inversion    Ankle eversion     (Blank rows = not tested)    FUNCTIONAL TESTS:  30 seconds chair stand test 11- below average for age  16 minute walk test: no assistive device 487 ft  Single leg stance : Rt: 60"; LT:     TODAY'S TREATMENT  03/27/22 Wall arch x 5 Supine:  Bridge x 10 Knee to chest single 3 x 30" Double knee to chest 3 x 30" Dead bug x 10 Side lying: Clam x 10 Hip abduction x 10  Prone: Opposite arm/leg raise x 10  Sitting: Sit to stand x 10  Nustep level 3 hills 3 x 5' 03/29/2022 Hip excursions Supine: Ab set Clam Bridge Dead bug Prone: Hip extension Press up Sit to stand  03/09/22 Seated piriformis stretch 10 sec holds x4  b/l Seated hamstring stretch with foot prop on stool 10 sec x4 b/l  Seated Physioball forward rolls 5 sec holds x15 reps Stair stretch for hamstring, groin and back, forward and then with rotation 10 sec holds x2 sets  b/l PPT with manual cueing, 3 breaths x8 rounds Hooklying LTR 3 sec holds  Prone hip extensions 2x5 Lying prone x3 mins Prone press ups x20   02/26/22:  Supine: Ab set x 10 with 20 sec hold with cue for breathing   Pelvic tilt x 20 with cue for full ROM   Bridge x 10 x 2 with cue for core activation and glut squeeze before lift    - Between sets Single knee to chest x 30 sec x 2 B    Bent knee raise = Supine marching with  cue for abdominal brace 2 x 10 reps   Straight leg raise with sciatic nerve glide ankle PF/DF modulation x 30 sec x 2 B    Lower trunk rotations x 10 reps B slow control cued  Sitting: Big ball roll out lumbar flexion x 10 reps   Sit to stand x 10 reps   Big ball roll out lumbar flexion round 2 x 10 reps        PATIENT EDUCATION:  Education  details: Sports administrator support and HEp Person educated: Patient Education method: Explanation Education comprehension: verbalized understanding and returned demonstration   HOME EXERCISE PROGRAM: 03/29/22 : updated HEP to include clam and dead bug Access Code: [facility] : [facility] URL: [email or web address] Date: 03/09/2022 Prepared by: Leota Jacobsen  Exercises - Seated Piriformis Stretch with Trunk Bend  - 1 x daily - 7 x weekly - 1 sets - 5 reps - 10 sec hold - Seated Hamstring Stretch with Chair  - 1 x daily - 7 x weekly - 1 sets - 5 reps - 10 sec hold - Seated Flexion Stretch with Swiss Ball  - 1 x daily - 7 x weekly - 2 sets - 10 reps - 5 sec hold - Supine Posterior Pelvic Tilt  - 1 x daily - 7 x weekly - 1 sets - 10 reps - 3 breathss hold - Supine Lower Trunk Rotation  - 1 x daily - 7 x weekly - 3 sets - 10 reps - 3sec hold - Prone Hip Extension  - 2 x daily - 7 x weekly - 1 sets - 10 reps - Lying Prone  - 2 x daily - 7 x weekly - 1 sets - 3 mins hold - Prone Press Up  - 2 x daily - 7 x weekly - 1 sets - 15 reps  [id number]: ab set, bridge, knee to chest   ASSESSMENT:  CLINICAL IMPRESSION:    PT given updated HEP, pt without questions at this time.  Advanced lumbar stability exercises with good technique.  Pt will continue to benefit from skilled PT to address functional impairments.   OBJECTIVE IMPAIRMENTS decreased activity tolerance, difficulty walking, decreased ROM, decreased strength, and pain.   ACTIVITY LIMITATIONS lifting, bending, sitting, squatting, stairs, and locomotion level  PARTICIPATION LIMITATIONS: cleaning, shopping, community activity, occupation, and yard work  PERSONAL FACTORS Time since onset of injury/illness/exacerbation are also affecting patient's functional outcome.   REHAB POTENTIAL: Good  CLINICAL DECISION MAKING: Stable/uncomplicated  EVALUATION COMPLEXITY: Low   GOALS: Goals reviewed with patient? No  SHORT TERM GOALS: Target  date: 04/19/2022  Pt to be I in Hep to decrease pain to no greater than a 4/10 Baseline: Goal status: MET  2.  Pt to be educated on the importance of limiting prolong sitting, proper body mechanics for bed mobility and lifting  Baseline:  Goal status: IN PROGRESS  3.  Pt to no longer have radicular sx to demonstrate decreased nerve irritation  Baseline:  Goal status: IN PROGRESS  LONG TERM GOALS: Target date: 05/10/2022  PT to be completing an advanced HEP to decrease pain to no greater than a 2/10 Baseline:  Goal status: MET  2.  Pt LE strength to be at least 4+/5 to allow pt to rise from a squatted position without using her hands  Baseline:  Goal status: MET  3.  Pt to be back walking at least 3 miles a day  Baseline:  Goal status: IN PROGRESS     PLAN: PT FREQUENCY: 2x/week  PT DURATION: 6 weeks  PLANNED INTERVENTIONS: Therapeutic exercises, Therapeutic activity, Neuromuscular re-education, Balance training, Gait training, Patient/Family education, and Manual therapy.  PLAN FOR NEXT SESSION: Continue with lumbar stabilization and flexion based stretches, advance functional weight bearing strengthening as well.     9:18 AM, 03/29/22  Rayetta Humphrey, PT CLT [phone]

## 2022-04-03 ENCOUNTER — Encounter (HOSPITAL_COMMUNITY): Payer: No Typology Code available for payment source | Admitting: Physical Therapy

## 2022-04-06 ENCOUNTER — Encounter (HOSPITAL_COMMUNITY): Payer: Self-pay

## 2022-04-06 ENCOUNTER — Ambulatory Visit (HOSPITAL_COMMUNITY): Payer: No Typology Code available for payment source

## 2022-04-06 DIAGNOSIS — M6281 Muscle weakness (generalized): Secondary | ICD-10-CM

## 2022-04-06 DIAGNOSIS — M5416 Radiculopathy, lumbar region: Secondary | ICD-10-CM

## 2022-04-06 NOTE — Therapy (Addendum)
OUTPATIENT PHYSICAL THERAPY THORACOLUMBAR TREATMENT   Patient Name: Belinda Arellano MRN: [id number] DOB:Aug 17, 1956, 66 y.o., female Today's Date: 04/06/2022  PHYSICAL THERAPY DISCHARGE SUMMARY  Visits from Start of Care: 6  Current functional level related to goals / functional outcomes: All goals met    Remaining deficits: Occasional pain    Education / Equipment: HEP   Patient agrees to discharge. Patient goals were met. Patient is being discharged due to being pleased with the current functional level.  END OF SESSION:   PT End of Session - 04/06/22 0729     Visit Number 6    Number of Visits 6   Date for PT Re-Evaluation 04/04/22    Authorization Type [place] preferred AETNA no visit limit medical review after 40    Progress Note Due on Visit 10    PT Start Time 0731    PT Stop Time 0812    PT Time Calculation (min) 41 min    Activity Tolerance Patient tolerated treatment well    Behavior During Therapy [facility] for tasks assessed/performed              Past Medical History:  Diagnosis Date   Anal fissure 10/2020   Arthritis    rt knee   Arthritis 06/16/2021   legs and shoulders   Congestion of nasal sinus    no fever   High cholesterol    Hypertension    Wears glasses 06/16/2021   Wears partial dentures 06/16/2021   Past Surgical History:  Procedure Laterality Date   ABDOMINAL HYSTERECTOMY     BREAST SURGERY Left    benign lumpectomy   COLONOSCOPY WITH PROPOFOL N/A 09/17/2016   Procedure: COLONOSCOPY WITH PROPOFOL;  Surgeon: Garlan Fair, MD;  [facility];  Service: Endoscopy;  Laterality: N/A;   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 11/10/2015   Procedure: RIGHT ACHILLES TENDON REPAIR, EXCISION PARTIAL CALCANEUS;  Surgeon: Ninetta Lights, MD;  Location: [place];  Service: Orthopedics;  Laterality: Right;  ANESTHESIA: GENERAL, BLOCK   EXCISION OF SKIN TAG N/A 06/22/2021   Procedure: EXCISION  OF SKIN TAG;  Surgeon: Leighton Ruff, MD;  Location: [place];  Service: General;  Laterality: N/A;   FOOT SURGERY Left    SPHINCTEROTOMY N/A 06/22/2021   Procedure: CHEMICAL SPHINCTEROTOMY;  Surgeon: Leighton Ruff, MD;  Location: [place];  Service: General;  Laterality: N/A;   TUBAL LIGATION     There are no problems to display for this patient.   PCP: Lajean Manes  REFERRING PROVIDER:  Earnie Larsson, MD  REFERRING DIAG: PT eval/tx for [phone] lumbar stenosis w/neurogenic claudication   Rationale for Evaluation and Treatment Rehabilitation  THERAPY DIAG:  Radiculopathy, lumbar region  Muscle weakness (generalized)  ONSET DATE: Chronic   SUBJECTIVE:  SUBJECTIVE STATEMENT:   Pt stated pain minimal maybe less than 1/10.  Reports compliance with HEP and feels she has made good progress.  No real pain, just stiffness today posterior thighs.  Reports minimal pulling while moving in bed.    PAIN:  Are you having pain? No Pain location: proximal posterior thigh stiffness Pain description: stiffness Aggravating factors: cleaned house on Saturday; sitting and initial motion after sitting once she walks for about 10 minutes the pain starts easing . Relieving factors: walking    PATIENT GOALS less pain to be able to walk more, to feel stronger in her legs, to walk three miles a day.    OBJECTIVE:   DIAGNOSTIC FINDINGS:  IMPRESSION: 1. L3-L4 mild-to-moderate spinal canal stenosis and mild left neural foraminal narrowing. 2. Narrowing of the lateral recesses at L4-L5 could affect the descending L5 nerve roots. 3. Severe facet arthropathy at L5-S1, which can be a cause of back pain.  PATIENT SURVEYS:  FOTO 57   now 29   POSTURE: increased lumbar lordosis  and decreased thoracic kyphosis  PALPATION: Not completed   LUMBAR ROM:   Active  A/PROM  eval AROM 04/06/22  Flexion Fingers 6" from floor reps no change  Able to touch toes 2in from floor  Extension 20 degrees reps: increases radicular sx   25 degrees no reports of pain  Right lateral flexion    Left lateral flexion    Right rotation    Left rotation     (Blank rows = not tested)   LOWER EXTREMITY MMT:    MMT Right eval Left eval Right  04/06/22 Left  04/06/22  Hip flexion 4+/5 5/5 5/5 5/5  Hip extension 2/5 2+/5 4+ 4/5  Hip abduction 4-/5 4/5 5/5 5/5  Hip adduction      Hip internal rotation      Hip external rotation      Knee flexion 5/5 4/5 5/5 4+  Knee extension 5/5 5/5 5/5 5/5  Ankle dorsiflexion 4/5 4/5 5/5 4+  Ankle plantarflexion      Ankle inversion      Ankle eversion       (Blank rows = not tested)    FUNCTIONAL TESTS:  30 seconds chair stand test 11- below average for age.  04/06/22: 30 seconds chair stand test- 16 times without HHA  2 minute walk test: no assistive device 487 ft 04/06/22:457f Single leg stance : Rt: 60"; LT: ; 04/06/22 Rt 60", Lt 46"   TODAY'S TREATMENT  04/06/22 2MWT MMT ROM measurements FOTO 30 second STS 16times SLS Reviewed proper lifting mechanics Lifting 8# box then additional 10# for lifting boxes of water bottles from floor 10x  03/27/22 Wall arch x 5 Supine:  Bridge x 10 Knee to chest single 3 x 30" Double knee to chest 3 x 30" Dead bug x 10 Side lying: Clam x 10 Hip abduction x 10  Prone: Opposite arm/leg raise x 10  Sitting: Sit to stand x 10  Nustep level 3 hills 3 x 5' 03/09/22 Seated piriformis stretch 10 sec holds x4  b/l Seated hamstring stretch with foot prop on stool 10 sec x4 b/l  Seated Physioball forward rolls 5 sec holds x15 reps Stair stretch for hamstring, groin and back, forward and then with rotation 10 sec holds x2 sets  b/l PPT with manual cueing, 3 breaths x8 rounds Hooklying LTR 3 sec  holds  Prone hip extensions 2x5 Lying prone x3 mins Prone press ups x20  02/26/22:  Supine: Ab set x 10 with 20 sec hold with cue for breathing   Pelvic tilt x 20 with cue for full ROM   Bridge x 10 x 2 with cue for core activation and glut squeeze before lift    - Between sets Single knee to chest x 30 sec x 2 B    Bent knee raise = Supine marching with cue for abdominal brace 2 x 10 reps   Straight leg raise with sciatic nerve glide ankle PF/DF modulation x 30 sec x 2 B    Lower trunk rotations x 10 reps B slow control cued  Sitting: Big ball roll out lumbar flexion x 10 reps   Sit to stand x 10 reps   Big ball roll out lumbar flexion round 2 x 10 reps        PATIENT EDUCATION:  Education details: Sports administrator support and HEp Person educated: Patient Education method: Explanation Education comprehension: verbalized understanding and returned demonstration   HOME EXERCISE PROGRAM: 03/29/22 : updated HEP to include clam and dead bug Access Code: [facility] URL: [email or web address] Date: 03/09/2022 Prepared by: Leota Jacobsen  Exercises - Seated Piriformis Stretch with Trunk Bend  - 1 x daily - 7 x weekly - 1 sets - 5 reps - 10 sec hold - Seated Hamstring Stretch with Chair  - 1 x daily - 7 x weekly - 1 sets - 5 reps - 10 sec hold - Seated Flexion Stretch with Swiss Ball  - 1 x daily - 7 x weekly - 2 sets - 10 reps - 5 sec hold - Supine Posterior Pelvic Tilt  - 1 x daily - 7 x weekly - 1 sets - 10 reps - 3 breathss hold - Supine Lower Trunk Rotation  - 1 x daily - 7 x weekly - 3 sets - 10 reps - 3sec hold - Prone Hip Extension  - 2 x daily - 7 x weekly - 1 sets - 10 reps - Lying Prone  - 2 x daily - 7 x weekly - 1 sets - 3 mins hold - Prone Press Up  - 2 x daily - 7 x weekly - 1 sets - 15 reps  [id number]: ab set, bridge, knee to chest   04/06/22:  squat and proper lifting mechanics ASSESSMENT:  CLINICAL IMPRESSION:    Reviewed goals this session with the  following findings:  Pt has met all STG 3/3 and LTGs 3/3.  Presents with improve mobility all in pain free range, increased cadence with 2MWT, increased STS over 30 seconds, improved self perceived functional abilities noted with FOTO score, improved strength for all tested musculature and presents with improved awareness for bed mobility and lifting mechanics. Pt instructed proper lifting mechanics with main difficulty lifting bottle water, able to demonstrated good mechanics following initial instructions.  Reviewed current HEP with additional handout for proper lifting.    OBJECTIVE IMPAIRMENTS decreased activity tolerance, difficulty walking, decreased ROM, decreased strength, and pain.   ACTIVITY LIMITATIONS lifting, bending, sitting, squatting, stairs, and locomotion level  PARTICIPATION LIMITATIONS: cleaning, shopping, community activity, occupation, and yard work  PERSONAL FACTORS Time since onset of injury/illness/exacerbation are also affecting patient's functional outcome.   REHAB POTENTIAL: Good  CLINICAL DECISION MAKING: Stable/uncomplicated  EVALUATION COMPLEXITY: Low   GOALS: Goals reviewed with patient? No  SHORT TERM GOALS: Target date: 04/27/2022  Pt to be I in Hep to decrease pain to no greater than a 4/10 Baseline: Goal status: MET  2.  Pt to be educated on the importance of limiting prolong sitting, proper body mechanics for bed mobility and lifting  Baseline: Pt stated she is aware of mechanics and practicing good form with bed mobility and lifting Goal status: MET  3.  Pt to no longer have radicular sx to demonstrate decreased nerve irritation  Baseline: 04/06/22:  Radicular symptoms resolved, no reports of symptoms while on vacation.   Goal status: MET  LONG TERM GOALS: Target date: 05/18/2022  PT to be completing an advanced HEP to decrease pain to no greater than a 2/10 Baseline: 04/06/22:  Pt reports compliance with advanced HEP. Goal status: MET  2.  Pt  LE strength to be at least 4+/5 to allow pt to rise from a squatted position without using her hands  Baseline:  Goal status: MET  3.  Pt to be back walking at least 3 miles a day  Baseline:  Reports walking 3 miles 5-6 days a week. Goal status: MET     PLAN: PT FREQUENCY: 2x/week  PT DURATION: 6 weeks  PLANNED INTERVENTIONS: Therapeutic exercises, Therapeutic activity, Neuromuscular re-education, Balance training, Gait training, Patient/Family education, and Manual therapy.  PLAN FOR NEXT SESSION: DC to HEP per goals met.   Ihor Austin, LPTA/CLT; [place], [place] [phone]  [phone] 04/06/22

## 2022-04-10 ENCOUNTER — Encounter (HOSPITAL_COMMUNITY): Payer: No Typology Code available for payment source

## 2022-04-13 ENCOUNTER — Encounter (HOSPITAL_COMMUNITY): Payer: No Typology Code available for payment source | Admitting: Physical Therapy

## 2022-07-23 ENCOUNTER — Encounter: Payer: Self-pay | Admitting: Obstetrics & Gynecology

## 2023-07-04 IMAGING — MR MR CERVICAL SPINE W/O CM
5 series · 34 of 48 positions shown · non-contrast
Comparison: Cervical spine radiographs 05/16/2021.

CLINICAL DATA: Neck pain. Additional history provided by scanning
technologist: Patient reports neck pain on left side for 3 months,
"stinging sensation" in left shoulder.

EXAM:
MRI CERVICAL SPINE WITHOUT CONTRAST
TECHNIQUE: Multiplanar, multisequence MR imaging of the cervical spine was
performed. No intravenous contrast was administered.

[Series 2: T2 · sagittal · 3.0mm · 0.41mm/px · 8 of 17 slices shown (1 of 2)]
[im 1/17]
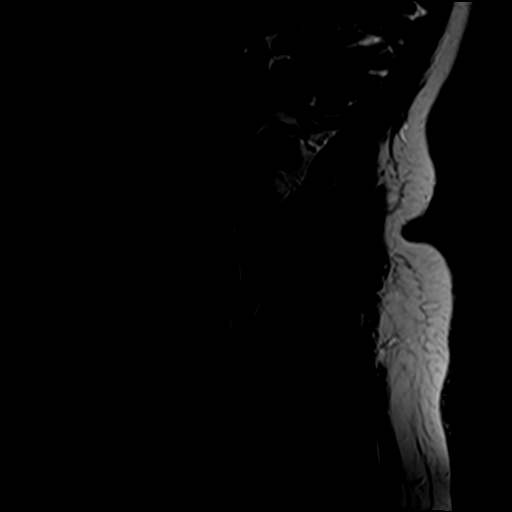
[im 3/17]
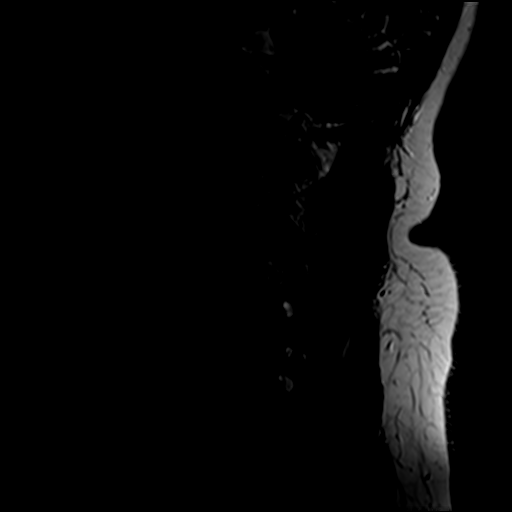
[im 5/17]
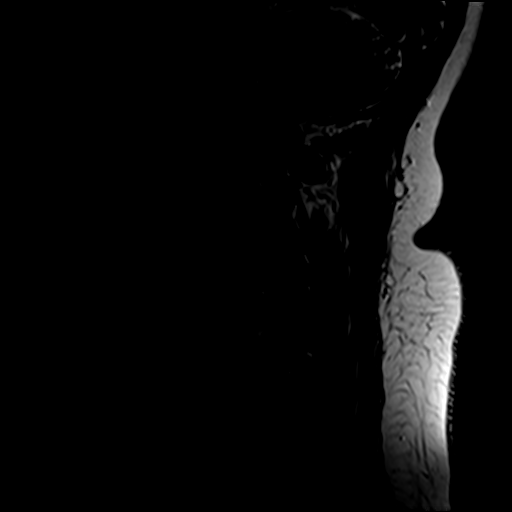
[im 7/17]
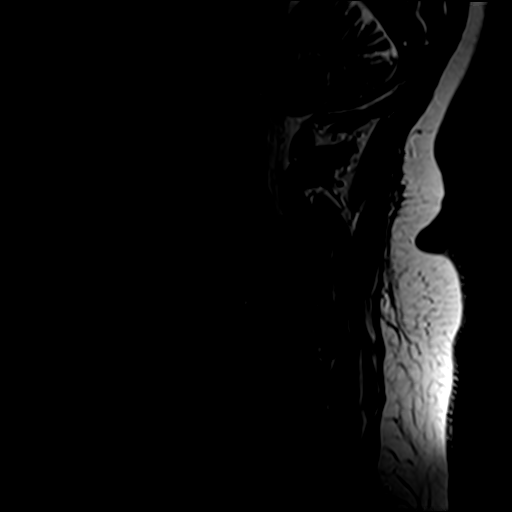
[im 10/17]
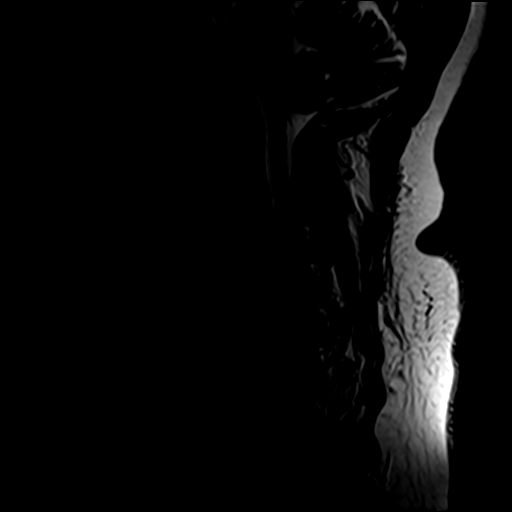
[im 12/17]
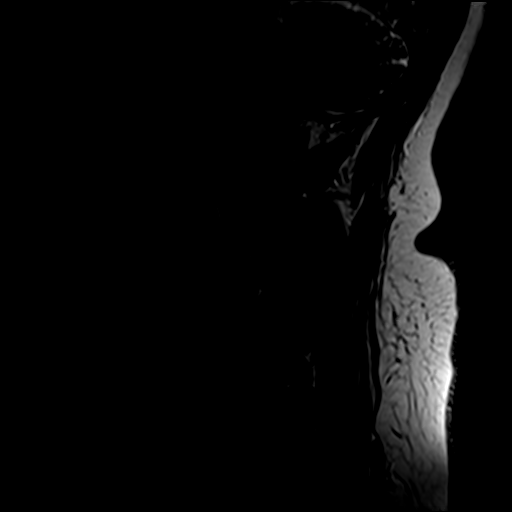
[im 14/17]
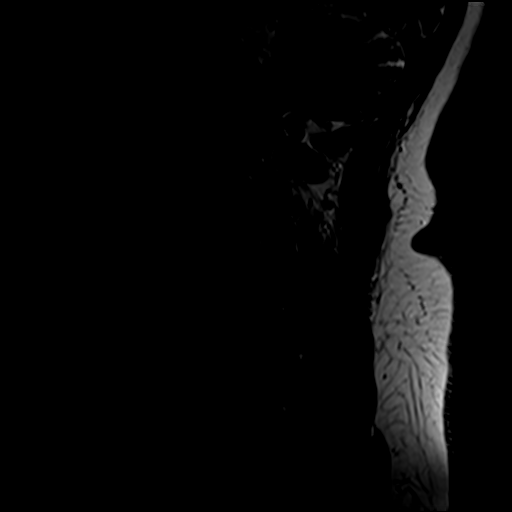
[im 17/17]
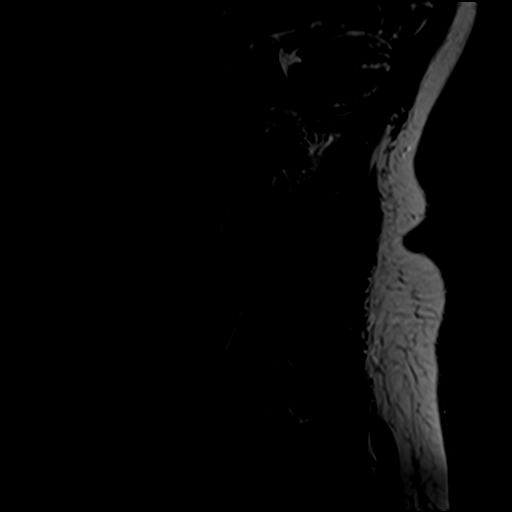

[Series 3: STIR · sagittal · 3.0mm · 0.82mm/px · 8 of 17 slices shown]
[im 1/17]
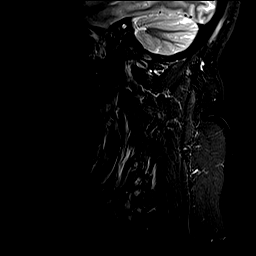
[im 3/17]
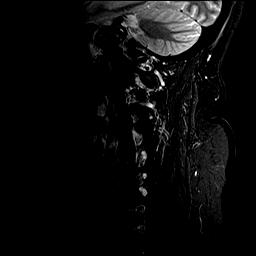
[im 5/17]
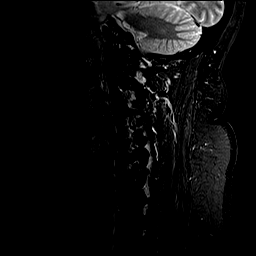
[im 7/17]
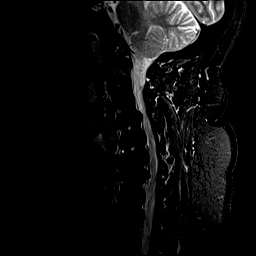
[im 10/17]
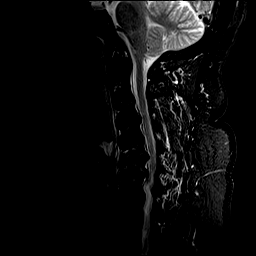
[im 12/17]
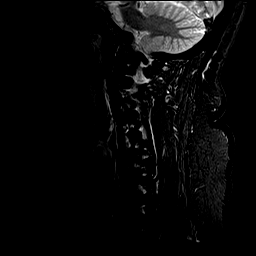
[im 14/17]
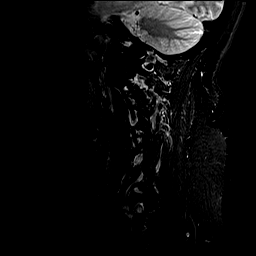
[im 17/17]
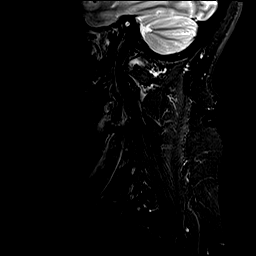

[Series 4: T1 · sagittal · 3.0mm · 0.82mm/px · 8 of 17 slices shown]
[im 1/17]
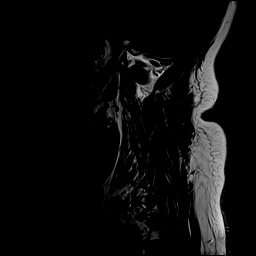
[im 3/17]
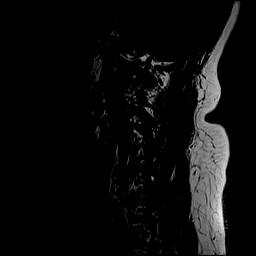
[im 5/17]
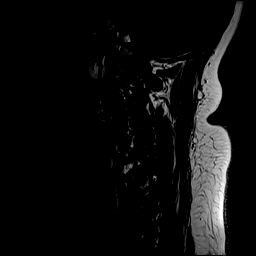
[im 7/17]
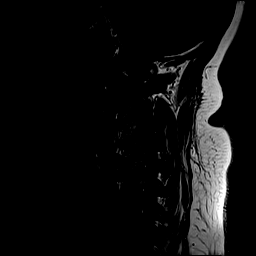
[im 10/17]
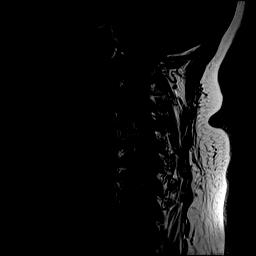
[im 12/17]
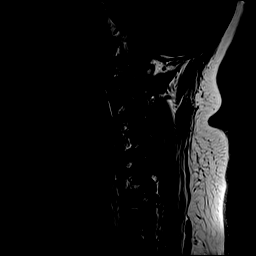
[im 14/17]
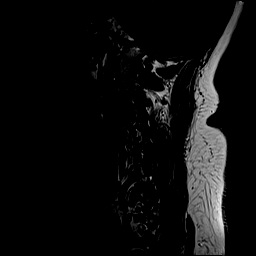
[im 17/17]
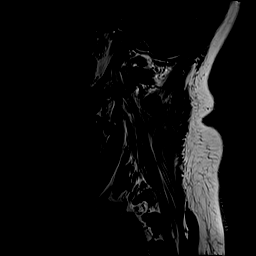

[Series 5: T2 · axial · 3.0mm · 0.70mm/px · z∈[-64,+27]mm · 9 of 26 slices shown (2 of 2)]
[im 1/26]
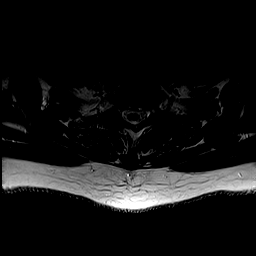
[im 5/26]
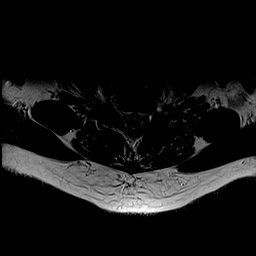
[im 7/26]
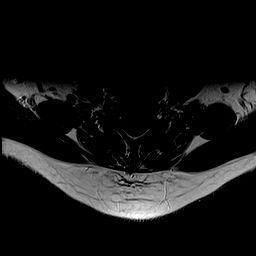
[im 12/26]
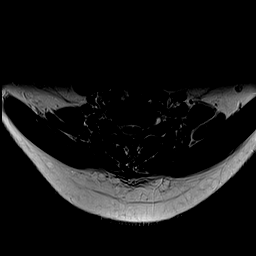
[im 14/26]
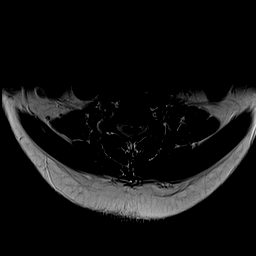
[im 19/26]
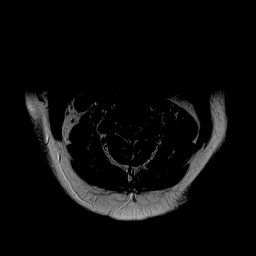
[im 21/26]
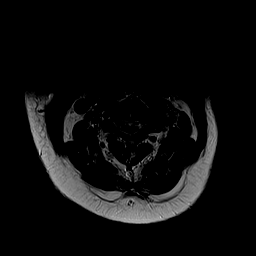
[im 23/26]
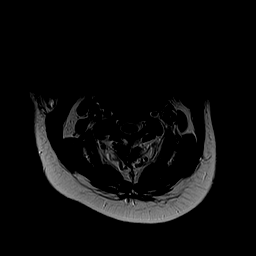
[im 26/26]
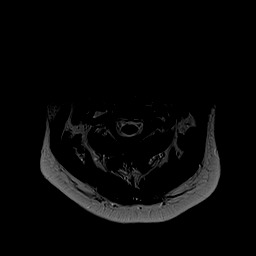

[Series 6: GRE · axial · 3.0mm · 0.35mm/px · 1 of 26 slices shown]
[im 1/26]
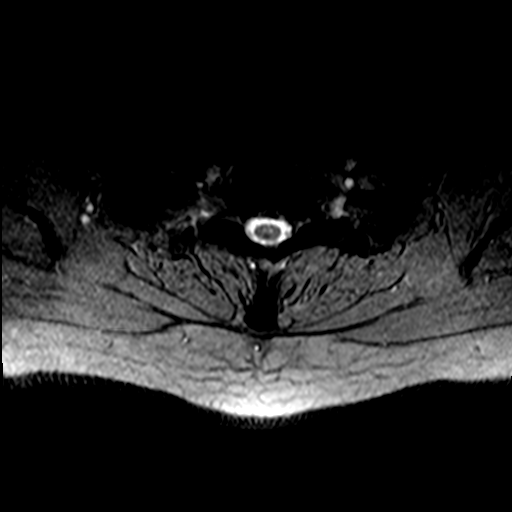

[34 of 48 positions shown; findings below may reference images not displayed]

FINDINGS: Alignment: Reversal of the expected cervical lordosis. No
significant spondylolisthesis.

Vertebrae: Vertebral body height is maintained. Mild multilevel
degenerative endplate irregularity. Trace degenerative endplate
edema at C5-C6 and C6-C7. Elsewhere, no significant marrow edema or
focal suspicious osseous lesion is identified. Multilevel ventral
osteophytes.

Cord: No spinal cord signal abnormality is identified. Multilevel
spinal cord flattening, as described below.

Posterior Fossa, vertebral arteries, paraspinal tissues: No
abnormality identified within included portions of the posterior
fossa. Flow voids preserved within the imaged cervical vertebral
arteries. Paraspinal soft tissues unremarkable.

Disc levels:

Moderate/advanced disc degeneration at C6-C7. Mild or
mild-to-moderate disc degeneration at the remaining levels.

C2-C3: Shallow disc bulge. Uncovertebral hypertrophy (predominantly
on the right). Facet arthrosis (greater on the right and advanced on
the right). No significant spinal canal stenosis. Mild right neural
foraminal narrowing.

C3-C4: Posterior disc osteophyte complex with bilateral disc
osteophyte ridge/uncinate hypertrophy. Facet arthrosis
(predominantly on the left). Mild spinal canal stenosis. The
posterior disc osteophyte complex contacts and minimally flattens
the ventral aspect of the spinal cord. However, the dorsal CSF space
is maintained. Bilateral neural foraminal narrowing (moderate/severe
right, severe left).

C4-C5: Posterior disc osteophyte complex more prominent to the left.
Left greater than right disc osteophyte ridge/uncinate hypertrophy.
Facet arthrosis (predominantly on the left). The posterior disc
osteophyte complex contacts and mildly flattens the left ventral
aspect of the spinal cord. However, the dorsal CSF space is
maintained. Bilateral neural foraminal narrowing (mild right,
moderate/severe left).

C5-C6: Posterior disc osteophyte complex with bilateral disc
osteophyte ridge/uncinate hypertrophy. The posterior disc osteophyte
complex contacts and minimally flattens the ventral spinal cord.
However, the dorsal CSF space is maintained. Severe bilateral neural
foraminal narrowing.

C6-C7: Posterior disc osteophyte complex with superimposed
broad-based central disc protrusion. Moderate spinal canal stenosis
with mild spinal cord flattening. Severe bilateral neural foraminal
narrowing.

C7-T1: Disc bulge. Facet arthrosis (greater on the right). No
significant spinal canal stenosis. Mild relative right neural
foraminal narrowing.
IMPRESSION: Cervical spondylosis, as outlined and with findings most notably as
follows.

At C6-C7, there is moderate/advanced disc degeneration. Posterior
disc osteophyte complex with bilateral disc osteophyte
ridge/uncinate hypertrophy. Superimposed broad-based central disc
protrusion. Moderate spinal canal stenosis with mild spinal cord
flattening. Severe bilateral neural foraminal narrowing.

There is contact upon the ventral spinal cord and mild spinal cord
flattening at several additional levels (however, the dorsal CSF
space is maintained within the spinal canal at these levels).

Additional sites of foraminal stenosis, as detailed and greatest
bilaterally at C3-C4 (moderate/severe right, severe left), on the
left at C4-C5 (moderate/severe) and bilaterally at C5-C6 (severe).

Minimal degenerative endplate edema at C5-C6 and C6-C7.

Reversal of the expected cervical lordosis.
